# Patient Record
Sex: Male | Born: 1965 | Race: Black or African American | Hispanic: Refuse to answer | Marital: Married | Smoking: Former smoker
Health system: Southern US, Community
[De-identification: ages and names within clinical notes are randomized; demographics above are authoritative.]

## PROBLEM LIST (undated history)

## (undated) DIAGNOSIS — J45909 Unspecified asthma, uncomplicated: Secondary | ICD-10-CM

## (undated) DIAGNOSIS — Z973 Presence of spectacles and contact lenses: Secondary | ICD-10-CM

## (undated) DIAGNOSIS — Z87442 Personal history of urinary calculi: Secondary | ICD-10-CM

## (undated) DIAGNOSIS — F17290 Nicotine dependence, other tobacco product, uncomplicated: Secondary | ICD-10-CM

## (undated) DIAGNOSIS — N400 Enlarged prostate without lower urinary tract symptoms: Secondary | ICD-10-CM

## (undated) DIAGNOSIS — J302 Other seasonal allergic rhinitis: Secondary | ICD-10-CM

## (undated) DIAGNOSIS — Z9109 Other allergy status, other than to drugs and biological substances: Secondary | ICD-10-CM

## (undated) DIAGNOSIS — T7840XA Allergy, unspecified, initial encounter: Secondary | ICD-10-CM

## (undated) HISTORY — DX: Nicotine dependence, other tobacco product, uncomplicated: F17.290

## (undated) HISTORY — PX: POLYPECTOMY: SHX149

## (undated) HISTORY — DX: Allergy, unspecified, initial encounter: T78.40XA

## (undated) HISTORY — DX: Other allergy status, other than to drugs and biological substances: Z91.09

## (undated) HISTORY — DX: Unspecified asthma, uncomplicated: J45.909

## (undated) HISTORY — PX: WISDOM TOOTH EXTRACTION: SHX21

## (undated) HISTORY — DX: Presence of spectacles and contact lenses: Z97.3

## (undated) HISTORY — DX: Other seasonal allergic rhinitis: J30.2

## (undated) HISTORY — DX: Benign prostatic hyperplasia without lower urinary tract symptoms: N40.0

---
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
## 2012-05-26 ENCOUNTER — Encounter: Payer: Self-pay | Admitting: Medical

## 2012-05-26 ENCOUNTER — Ambulatory Visit (INDEPENDENT_AMBULATORY_CARE_PROVIDER_SITE_OTHER): Payer: BC Managed Care – PPO | Admitting: Medical

## 2012-05-26 VITALS — BP 102/80 | HR 60 | Temp 98.2°F | Ht 73.0 in | Wt 230.0 lb

## 2012-05-26 DIAGNOSIS — L918 Other hypertrophic disorders of the skin: Secondary | ICD-10-CM

## 2012-05-26 DIAGNOSIS — L909 Atrophic disorder of skin, unspecified: Secondary | ICD-10-CM

## 2012-05-26 DIAGNOSIS — R1310 Dysphagia, unspecified: Secondary | ICD-10-CM

## 2012-05-26 DIAGNOSIS — Z0000 Encounter for general adult medical examination without abnormal findings: Secondary | ICD-10-CM

## 2012-05-26 DIAGNOSIS — Z23 Encounter for immunization: Secondary | ICD-10-CM

## 2012-05-26 LAB — POCT URINALYSIS DIPSTICK
Bilirubin, UA: NEGATIVE
Blood, UA: NEGATIVE
Ketones, UA: NEGATIVE
Nitrite, UA: NEGATIVE
pH, UA: 5

## 2012-05-26 NOTE — Progress Notes (Signed)
Subjective:   HPI  Alexander Dean is a 46 y.o. male who presents for a complete physical.  Here as a new patient.  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  Working at [employer] in athletics.     Preventative care: Last ophthalmology visit: 2011 Last dental visit:04/2012 Last colonoscopy:N/A Last prostate exam: 2011 Last ZOX:WRUEAV Last labs:2011  Prior vaccinations: TD or Tdap:UNSURE Influenza:NO, declines Pneumococcal:N/A Shingles/Zostavax:N/A  Advanced directive:N/A Health care power of attorney:YES Living will:N/A  Concerns: Skin lesion left inner thigh, rubs and chafes when running, has to put bandaid over this.  Having issues with solid food feeling like it gets stuck in chest.  Not all the time, intermittent, no vomiting, no problems with water or liquids.  Reviewed their medical, surgical, family, social, medication, and allergy history and updated chart as appropriate.   Past Medical History  Diagnosis Date  . Asthma   . Seasonal allergic rhinitis   . Wears glasses     Past Surgical History  Procedure Date  . Wisdom tooth extraction   . Appendectomy 1983    Family History  Problem Relation Age of Onset  . Other Mother     renal stones  . Diabetes Mother   . Hypertension Mother   . Arthritis Father     knees  . Other Father   . Cancer Neg Hx   . Heart disease Neg Hx   . Hyperlipidemia Neg Hx     History   Social History  . Marital Status: Married    Spouse Name: N/A    Number of Children: N/A  . Years of Education: N/A   Occupational History  . Not on file.   Social History Main Topics  . Smoking status: Current Some Day Smoker    Types: Cigars  . Smokeless tobacco: Not on file  . Alcohol Use: 0.0 oz/week    0 drink(s) per week  . Drug Use: No  . Sexually Active: Not on file   Other Topics Concern  . Not on file   Social History Narrative   Married, no children, exercises 4 days per week, 90 min  per day, works in [employer], [place] [employer]    No current outpatient prescriptions on file prior to visit.    No Known Allergies    Review of Systems Constitutional: -fever, -chills, -sweats, -unexpected weight change, -decreased appetite, -fatigue Allergy: -sneezing, -itching, -congestion Dermatology: -changing moles, --rash, -lumps ENT: -runny nose, -ear pain, -sore throat, -hoarseness, -sinus pain, -teeth pain, - ringing in ears, -hearing loss, -nosebleeds Cardiology: -chest pain, -palpitations, -swelling, -difficulty breathing when lying flat, -waking up short of breath Respiratory: -cough, -shortness of breath, -difficulty breathing with exercise or exertion, -wheezing, -coughing up blood Gastroenterology: +abdominal pain, -nausea, -vomiting, -diarrhea, -constipation, -blood in stool, -changes in bowel movement, -difficulty swallowing or eating Hematology: -bleeding, -bruising  Musculoskeletal: -joint aches, -muscle aches, -joint swelling, -back pain, -neck pain, -cramping, -changes in gait Ophthalmology: denies vision changes, eye redness, itching, discharge Urology: -burning with urination, -difficulty urinating, -blood in urine, -urinary frequency, -urgency, -incontinence Neurology: -headache, -weakness, -tingling, -numbness, -memory loss, -falls, -dizziness Psychology: -depressed mood, -agitation, -sleep problems     Objective:   Physical Exam  Filed Vitals:   05/26/12 1022  Height: 6\' 1"  (1.854 m)  Weight: 230 lb (104.327 kg)    General appearance: alert, no distress, WD/WN, AA male Skin: left superior inner thigh with pedunculated 3mm x 4mm lesion , few  scattered macules, no worrisome lesions HEENT: normocephalic, conjunctiva/corneas normal, sclerae anicteric, PERRLA, EOMi, nares patent, no discharge or erythema, pharynx normal Oral cavity: MMM, tongue normal, teeth in good repair Neck: supple, no lymphadenopathy, no thyromegaly, no masses, normal ROM, no  bruits Chest: non tender, normal shape and expansion Heart: RRR, normal S1, S2, no murmurs Lungs: CTA bilaterally, no wheezes, rhonchi, or rales Abdomen: +bs, soft, non tender, non distended, no masses, no hepatomegaly, no splenomegaly, no bruits Back: non tender, normal ROM, no scoliosis Musculoskeletal: upper extremities non tender, no obvious deformity, normal ROM throughout, lower extremities non tender, no obvious deformity, normal ROM throughout Extremities: no edema, no cyanosis, no clubbing Pulses: 2+ symmetric, upper and lower extremities, normal cap refill Neurological: alert, oriented x 3, CN2-12 intact, strength normal upper extremities and lower extremities, sensation normal throughout, DTRs 2+ throughout, no cerebellar signs, gait normal Psychiatric: normal affect, behavior normal, pleasant  GU: normal male external genitalia, circumcised, nontender, no masses, no hernia, no lymphadenopathy Rectal: anus normal, prostate WNL, no obvious nodules   Assessment and Plan :      Encounter Diagnoses  Name Primary?  . Routine general medical examination at a health care facility Yes  . Need for Tdap vaccination   . Dysphagia   . Skin tag     Physical exam - discussed healthy lifestyle, diet, exercise, preventative care, vaccinations, and addressed their concerns.  Advised yearly dental and ophthalmology f/u.  Tdap vaccine, VIS and counseling given.  Dysphagia - advised avoid GERD triggers, advised he drink more water with his daily multivitamin or consider stopping multivitamin for a week or 2 and see if this resolves the issue.  If not improving consider adding OTC Prilosec or Zantac.  Recheck in 58mo.  If not improving, consider swallow study.    Skin tag - return at his convenience for excision.  Follow-up pending labs

## 2012-05-27 LAB — COMPREHENSIVE METABOLIC PANEL
ALT: 16 U/L (ref 0–53)
AST: 22 U/L (ref 0–37)
Albumin: 4.5 g/dL (ref 3.5–5.2)
Calcium: 9.8 mg/dL (ref 8.4–10.5)
Chloride: 104 mEq/L (ref 96–112)
Potassium: 4.8 mEq/L (ref 3.5–5.3)

## 2012-05-27 LAB — CBC WITH DIFFERENTIAL/PLATELET
Basophils Absolute: 0 K/uL (ref 0.0–0.1)
Basophils Relative: 0 % (ref 0–1)
Eosinophils Absolute: 0.2 K/uL (ref 0.0–0.7)
Eosinophils Relative: 3 % (ref 0–5)
HCT: 44.8 % (ref 39.0–52.0)
Hemoglobin: 15 g/dL (ref 13.0–17.0)
Lymphocytes Relative: 40 % (ref 12–46)
Lymphs Abs: 2.8 K/uL (ref 0.7–4.0)
MCH: 30.4 pg (ref 26.0–34.0)
MCHC: 33.5 g/dL (ref 30.0–36.0)
MCV: 90.7 fL (ref 78.0–100.0)
Monocytes Absolute: 0.4 K/uL (ref 0.1–1.0)
Monocytes Relative: 6 % (ref 3–12)
Neutro Abs: 3.5 K/uL (ref 1.7–7.7)
Neutrophils Relative %: 51 % (ref 43–77)
Platelets: 210 K/uL (ref 150–400)
RBC: 4.94 MIL/uL (ref 4.22–5.81)
RDW: 14.6 % (ref 11.5–15.5)
WBC: 6.9 K/uL (ref 4.0–10.5)

## 2012-05-27 LAB — LIPID PANEL
Cholesterol: 131 mg/dL (ref 0–200)
HDL: 41 mg/dL
LDL Cholesterol: 65 mg/dL (ref 0–99)
Total CHOL/HDL Ratio: 3.2 ratio
Triglycerides: 123 mg/dL
VLDL: 25 mg/dL (ref 0–40)

## 2013-06-22 ENCOUNTER — Encounter: Payer: Self-pay | Admitting: Medical

## 2013-06-22 ENCOUNTER — Ambulatory Visit (INDEPENDENT_AMBULATORY_CARE_PROVIDER_SITE_OTHER): Payer: BC Managed Care – PPO | Admitting: Medical

## 2013-06-22 VITALS — BP 110/70 | HR 72 | Temp 97.8°F | Resp 16 | Ht 72.0 in | Wt 227.0 lb

## 2013-06-22 DIAGNOSIS — D492 Neoplasm of unspecified behavior of bone, soft tissue, and skin: Secondary | ICD-10-CM

## 2013-06-22 DIAGNOSIS — Z0000 Encounter for general adult medical examination without abnormal findings: Secondary | ICD-10-CM

## 2013-06-22 DIAGNOSIS — N400 Enlarged prostate without lower urinary tract symptoms: Secondary | ICD-10-CM

## 2013-06-22 LAB — POCT URINALYSIS DIPSTICK
Blood, UA: NEGATIVE
Glucose, UA: NEGATIVE
Ketones, UA: NEGATIVE
Spec Grav, UA: <=1.005
Urobilinogen, UA: NEGATIVE

## 2013-06-22 LAB — BASIC METABOLIC PANEL
BUN: 18 mg/dL (ref 6–23)
CO2: 27 mEq/L (ref 19–32)
Glucose, Bld: 79 mg/dL (ref 70–99)
Potassium: 4.1 mEq/L (ref 3.5–5.3)

## 2013-06-22 NOTE — Progress Notes (Addendum)
Subjective:   HPI  Alexander Dean is a 47 y.o. male who presents for a complete physical.  Doing well, no c/o.    Preventative care: Last ophthalmology visit:yes -[facility] Last dental visit:yes [facility] Last colonoscopy:n/a Last prostate exam: n/a Last EKG:n/a Last labs:2013  Prior vaccinations: TD or Tdap:05/2012 Influenza:no flu vaccine, declines Pneumococcal:n/a Shingles/Zostavax:n/   Advanced directive:n/a  Health care power of attorney:n/a Living will:n/   Concerns: Skin lesion, left upper inner thigh, catches on underwear at times.  Would like this removed.  No other c/o.  Reviewed their medical, surgical, family, social, medication, and allergy history and updated chart as appropriate.  Past Medical History  Diagnosis Date  . Seasonal allergic rhinitis   . Wears glasses   . Asthma     childhood  . BPH (benign prostatic hypertrophy)     per exam and PSA, asymptomatic    Past Surgical History  Procedure Laterality Date  . Wisdom tooth extraction    . Appendectomy  1983    History   Social History  . Marital Status: Married    Spouse Name: N/A    Number of Children: N/A  . Years of Education: N/A   Occupational History  . Not on file.   Social History Main Topics  . Smoking status: Current Some Day Smoker    Types: Cigars  . Smokeless tobacco: Not on file     Comment: one cigar weekly  . Alcohol Use: 0.0 oz/week    0 drink(s) per week  . Drug Use: No  . Sexual Activity: Not on file   Other Topics Concern  . 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 Other Mother     renal stones  . Diabetes Mother   . Hypertension Mother   . Arthritis Father     knees  . Other Father     renal stones  . Cancer Neg Hx   .  Hyperlipidemia Neg Hx     Current outpatient prescriptions:Multiple Vitamin (MULTIVITAMIN) capsule, Take 1 capsule by mouth daily., Disp: , Rfl:   No Known Allergies   Review of Systems Constitutional: -fever, -chills, -sweats, -unexpected weight change, -decreased appetite, -fatigue Allergy: -sneezing, -itching, -congestion Dermatology: -changing moles, --rash, -lumps ENT: -runny nose, -ear pain, -sore throat, -hoarseness, -sinus pain, -teeth pain, - ringing in ears, -hearing loss, -nosebleeds Cardiology: -chest pain, -palpitations, -swelling, -difficulty breathing when lying flat, -waking up short of breath Respiratory: -cough, -shortness of breath, -difficulty breathing with exercise or exertion, -wheezing, -coughing up blood Gastroenterology: -abdominal pain, -nausea, -vomiting, -diarrhea, +constipation, -blood in stool, -changes in bowel movement, -difficulty swallowing or eating Hematology: -bleeding, -bruising  Musculoskeletal: -joint aches, -muscle aches, -joint swelling, -back pain, -neck pain, -cramping, -changes in gait Ophthalmology: denies vision changes, eye redness, itching, discharge Urology: -burning with urination, -difficulty urinating, -blood in urine, -urinary frequency, -urgency, -incontinence Neurology: -headache, -weakness, -tingling, -numbness, -memory loss, -falls, -dizziness Psychology: -depressed mood, -agitation, -sleep problems     Objective:   Physical Exam  Filed Vitals:   06/22/13 1044  BP: 110/70  Pulse: 72  Temp: 97.8 F (36.6 C)  Resp: 16    General appearance: alert, no distress, WD/WN, AA male Skin: left upper inner thigh with 3mm x  2mm pedunculated fleshy lesion, likely skin tag, right lower abdomen with 1.4 cm x 1.4cm squarish flat brown lesion c/w birth mark similar small 1.5cm x 0.5cm rectangular flat  Brown lesion of right lower back, few other scattered benign appearing macules, no other worrisome lesions HEENT: normocephalic,  conjunctiva/corneas normal, sclerae anicteric, PERRLA, EOMi, nares patent, no discharge or erythema, pharynx normal Oral cavity: MMM, tongue normal, teeth  - left upper molar and right upper molar, both with some fractures of the tooth, otherwise teeth in good repair Neck: supple, no lymphadenopathy, no thyromegaly, no masses, normal ROM, no bruits Chest: non tender, normal shape and expansion Heart: RRR, normal S1, S2, no murmurs Lungs: CTA bilaterally, no wheezes, rhonchi, or rales Abdomen: +bs, soft, RLQ surgical scar, non tender, non distended, no masses, no hepatomegaly, no splenomegaly, no bruits Back: non tender, normal ROM, no scoliosis Musculoskeletal: upper extremities non tender, no obvious deformity, normal ROM throughout, lower extremities non tender, no obvious deformity, normal ROM throughout Extremities: no edema, no cyanosis, no clubbing Pulses: 2+ symmetric, upper and lower extremities, normal cap refill Neurological: alert, oriented x 3, CN2-12 intact, strength normal upper extremities and lower extremities, sensation normal throughout, DTRs 2+ throughout, no cerebellar signs, gait normal Psychiatric: normal affect, behavior normal, pleasant  GU: normal male external genitalia, nontender, no masses, no hernia, no lymphadenopathy Rectal: anus normal tone and appearance, prostate generally enlarged, no nodules, nontender, otherwise unremarkable   Assessment and Plan :      Encounter Diagnoses  Name Primary?  . Routine general medical examination at a health care facility Yes  . BPH (benign prostatic hypertrophy)   . Neoplasm of skin     Physical exam - discussed healthy lifestyle, diet, exercise, preventative care, vaccinations, and addressed their concerns.   BPH - asymptomatic, PSA monitoring today. Neoplasm - likely skin tag, but sent to pathology to confirm/rule out other.  Discussed wound care.  F/u prn.  Follow-up pending labs, pathology results.   Addendum: I  inadvertently forgot to document the procedure. Discussed risk and benefits of procedure, patient gave consent, use 1% lidocaine with epinephrine for local anesthesia of left upper thigh, used sterile razor to shave biopsy the 3mm x 2mm pedunculated fleshy lesion, likely skin tag.  Hemostasis achieved with direct pressure and silver nitrate applied. Estimated blood loss less than 1 cc, patient tolerated procedure well, lesion sent to pathology.

## 2013-06-24 ENCOUNTER — Other Ambulatory Visit: Payer: Self-pay | Admitting: Medical

## 2013-06-24 DIAGNOSIS — R7989 Other specified abnormal findings of blood chemistry: Secondary | ICD-10-CM

## 2013-06-26 ENCOUNTER — Telehealth: Payer: Self-pay | Admitting: Family Medicine

## 2013-06-26 NOTE — Telephone Encounter (Signed)
Pt called and wants status for renal ultrasound.  Please call pt [phone].  I see order no appt.  Please advise pt.

## 2013-06-29 NOTE — Telephone Encounter (Signed)
Patient is aware of his appointment. CLS

## 2013-07-07 ENCOUNTER — Ambulatory Visit
Admission: RE | Admit: 2013-07-07 | Discharge: 2013-07-07 | Disposition: A | Discharge status: Home / Self Care | Payer: BC Managed Care – PPO | Source: Ambulatory Visit | Attending: Medical | Admitting: Medical

## 2013-07-07 DIAGNOSIS — R7989 Other specified abnormal findings of blood chemistry: Secondary | ICD-10-CM

## 2013-07-10 ENCOUNTER — Encounter: Payer: Self-pay | Admitting: Medical

## 2013-07-10 ENCOUNTER — Ambulatory Visit (INDEPENDENT_AMBULATORY_CARE_PROVIDER_SITE_OTHER): Payer: BC Managed Care – PPO | Admitting: Medical

## 2013-07-10 VITALS — BP 112/80 | HR 72 | Temp 98.0°F | Resp 16 | Wt 225.0 lb

## 2013-07-10 DIAGNOSIS — R7989 Other specified abnormal findings of blood chemistry: Secondary | ICD-10-CM

## 2013-07-10 DIAGNOSIS — R51 Headache: Secondary | ICD-10-CM

## 2013-07-10 DIAGNOSIS — R935 Abnormal findings on diagnostic imaging of other abdominal regions, including retroperitoneum: Secondary | ICD-10-CM

## 2013-07-10 DIAGNOSIS — R799 Abnormal finding of blood chemistry, unspecified: Secondary | ICD-10-CM

## 2013-07-10 DIAGNOSIS — N400 Enlarged prostate without lower urinary tract symptoms: Secondary | ICD-10-CM

## 2013-07-10 LAB — PHOSPHORUS: PHOSPHORUS: 3.3 mg/dL (ref 2.3–4.6)

## 2013-07-10 NOTE — Progress Notes (Signed)
  Subjective:  Alexander Dean is a 48 y.o. male who presents for recheck to discuss abnormal labs and f/u.  We discussed his recent abnormal findings regarding prostate and kidney function. He has no BPH symptoms at this time, no fevers weight loss night sweats. He denies a history of abnormal creatinine or kidney function, but has had microscopic hematuria in the past. He has never seen urology or nephrology. He says he drinks a lot of water, he does take BC powders occasionally for headaches on average once weekly, but denies other NSAID use. Denies dark urine or urinary changes otherwise.  No other c/o.  The following portions of the patient's history were reviewed and updated as appropriate: allergies, current medications, past family history, past medical history, past social history, past surgical history and problem list.  ROS Otherwise as in subjective above  Objective: Physical Exam  BP 112/80  Pulse 72  Temp(Src) 98 F (36.7 C) (Oral)  Resp 16  Wt 225 lb (102.059 kg)   General appearance: alert, no distress, WD/WN   Assessment: Encounter Diagnoses  Name Primary?  . Elevated serum creatinine Yes  . BPH (benign prostatic hypertrophy)   . Abnormal ultrasound of abdomen   . Headache(784.0)     Plan: I discussed the recent abnormal findings which include enlarged prostate on exam, mildly elevated PSA, abnormal abdomen ultrasound regarding the prostate although the kidneys looked normal, abnormal serum creatinine.  I advise he stop NSAIDs and BC powders. For his intermittent headaches can use Tylenol over-the-counter, discussed headache prevention.  We will plan to recheck PSA and prostate exam in 4-6 months unless he has any symptoms of BPH sooner.  He is drinking plenty of water. Discussed possible causes of abnormal kidney lab. I answered his questions today. Follow up: pending labs, 83mo on prostate issues

## 2013-07-11 LAB — MICROALBUMIN / CREATININE URINE RATIO
Creatinine, Urine: 107 mg/dL
MICROALB UR: 0.5 mg/dL (ref 0.00–1.89)
Microalb Creat Ratio: 4.7 mg/g (ref 0.0–30.0)

## 2013-07-11 LAB — URINALYSIS, MICROSCOPIC ONLY
Bacteria, UA: NONE SEEN
Casts: NONE SEEN
Crystals: NONE SEEN
SQUAMOUS EPITHELIAL / LPF: NONE SEEN

## 2013-07-11 LAB — VITAMIN D 25 HYDROXY (VIT D DEFICIENCY, FRACTURES): VIT D 25 HYDROXY: 33 ng/mL (ref 30–89)

## 2013-07-16 ENCOUNTER — Encounter: Payer: Self-pay | Admitting: Medical

## 2014-06-23 ENCOUNTER — Encounter: Payer: Self-pay | Admitting: Medical

## 2014-06-23 ENCOUNTER — Ambulatory Visit (INDEPENDENT_AMBULATORY_CARE_PROVIDER_SITE_OTHER): Payer: BC Managed Care – PPO | Admitting: Medical

## 2014-06-23 VITALS — BP 100/70 | HR 78 | Temp 97.6°F | Resp 16 | Ht 72.0 in | Wt 235.0 lb

## 2014-06-23 DIAGNOSIS — M791 Myalgia: Secondary | ICD-10-CM

## 2014-06-23 DIAGNOSIS — M546 Pain in thoracic spine: Secondary | ICD-10-CM

## 2014-06-23 DIAGNOSIS — G479 Sleep disorder, unspecified: Secondary | ICD-10-CM

## 2014-06-23 DIAGNOSIS — R312 Other microscopic hematuria: Secondary | ICD-10-CM

## 2014-06-23 DIAGNOSIS — R748 Abnormal levels of other serum enzymes: Secondary | ICD-10-CM

## 2014-06-23 DIAGNOSIS — M549 Dorsalgia, unspecified: Secondary | ICD-10-CM

## 2014-06-23 DIAGNOSIS — Z0000 Encounter for general adult medical examination without abnormal findings: Secondary | ICD-10-CM

## 2014-06-23 DIAGNOSIS — R0683 Snoring: Secondary | ICD-10-CM

## 2014-06-23 DIAGNOSIS — R7989 Other specified abnormal findings of blood chemistry: Secondary | ICD-10-CM

## 2014-06-23 DIAGNOSIS — M7918 Myalgia, other site: Secondary | ICD-10-CM

## 2014-06-23 DIAGNOSIS — Z720 Tobacco use: Secondary | ICD-10-CM

## 2014-06-23 DIAGNOSIS — N400 Enlarged prostate without lower urinary tract symptoms: Secondary | ICD-10-CM

## 2014-06-23 DIAGNOSIS — R3129 Other microscopic hematuria: Secondary | ICD-10-CM

## 2014-06-23 DIAGNOSIS — F17200 Nicotine dependence, unspecified, uncomplicated: Secondary | ICD-10-CM | POA: Insufficient documentation

## 2014-06-23 LAB — COMPREHENSIVE METABOLIC PANEL
ALT: 48 U/L (ref 0–53)
AST: 41 U/L — AB (ref 0–37)
Albumin: 4.2 g/dL (ref 3.5–5.2)
Alkaline Phosphatase: 45 U/L (ref 39–117)
BILIRUBIN TOTAL: 0.4 mg/dL (ref 0.2–1.2)
BUN: 18 mg/dL (ref 6–23)
CALCIUM: 9.3 mg/dL (ref 8.4–10.5)
CO2: 27 mEq/L (ref 19–32)
CREATININE: 1.55 mg/dL — AB (ref 0.50–1.35)
Chloride: 104 mEq/L (ref 96–112)
Glucose, Bld: 81 mg/dL (ref 70–99)
Potassium: 4.3 mEq/L (ref 3.5–5.3)
Sodium: 140 mEq/L (ref 135–145)
Total Protein: 7 g/dL (ref 6.0–8.3)

## 2014-06-23 LAB — POCT URINALYSIS DIPSTICK
Bilirubin, UA: NEGATIVE
Glucose, UA: NEGATIVE
Ketones, UA: NEGATIVE
Leukocytes, UA: NEGATIVE
Nitrite, UA: NEGATIVE
PROTEIN UA: NEGATIVE
Spec Grav, UA: >=1.03
Urobilinogen, UA: NEGATIVE
pH, UA: 6

## 2014-06-23 LAB — LIPID PANEL
CHOL/HDL RATIO: 3.9 ratio
CHOLESTEROL: 169 mg/dL (ref 0–200)
HDL: 43 mg/dL (ref >39)
LDL Cholesterol: 88 mg/dL (ref 0–99)
Triglycerides: 188 mg/dL — ABNORMAL HIGH (ref <150)
VLDL: 38 mg/dL (ref 0–40)

## 2014-06-23 NOTE — Progress Notes (Signed)
Subjective:   HPI  Alexander Dean is a 48 y.o. male who presents for a complete physical.   Preventative care: Last ophthalmology visit:yes [facility] 07/2013- last exam Last dental visit:yes [facility] Last colonoscopy:n//a Last prostate exam: Last EKG:n/a Last labs:?  Prior vaccinations: TD or ZHGD:9242 Influenza:declined flu vaccine Pneumococcal:n/a Shingles/Zostavax:n/a  Concerns: Recently having some right upper back pain and left buttock pain.   Sometimes gets upper back pain when he awakes from sleeping.   Was runnning more strenuously recently, may have pulled muscle in left buttock.   Reviewed their medical, surgical, family, social, medication, and allergy history and updated chart as appropriate.  Past Medical History  Diagnosis Date  . Seasonal allergic rhinitis   . Wears glasses   . Asthma     childhood  . BPH (benign prostatic hypertrophy)     per exam and PSA, asymptomatic  . Cigar smoker     Past Surgical History  Procedure Laterality Date  . Wisdom tooth extraction    . Appendectomy  1983    History   Social History  . Marital Status: Married    Spouse Name: N/A    Number of Children: N/A  . Years of Education: N/A   Occupational History  . Not on file.   Social History Main Topics  . Smoking status: Current Some Day Smoker    Types: Cigars  . Smokeless tobacco: Not on file     Comment: one cigar weekly  . Alcohol Use: 0.0 oz/week    0 Not specified per week  . Drug Use: No  . Sexual Activity: Not on file   Other Topics Concern  . 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 Other Mother     renal stones  . Diabetes Mother   . Hypertension Mother   . Arthritis Father     knees  . Other Father     renal stones  . Cancer Neg  Hx   . Hyperlipidemia Neg Hx     Current outpatient prescriptions: Multiple Vitamin (MULTIVITAMIN) capsule, Take 1 capsule by mouth daily., Disp: , Rfl:   No Known Allergies   Review of Systems Constitutional: -fever, -chills, -sweats, -unexpected weight change, -decreased appetite, -fatigue Allergy: -sneezing, -itching, -congestion Dermatology: -changing moles, --rash, -lumps ENT: -runny nose, -ear pain, -sore throat, -hoarseness, -sinus pain, -teeth pain, - ringing in ears, -hearing loss, -nosebleeds Cardiology: -chest pain, -palpitations, -swelling, -difficulty breathing when lying flat, -waking up short of breath Respiratory: -cough, -shortness of breath, -difficulty breathing with exercise or exertion, -wheezing, -coughing up blood Gastroenterology: -abdominal pain, -nausea, -vomiting, -diarrhea, -constipation, -blood in stool, -changes in bowel movement, -difficulty swallowing or eating Hematology: -bleeding, -bruising  Musculoskeletal: +joint aches(left hip area when running), -muscle aches, -joint swelling, -back pain, +neck pain(crick in his neck), -cramping, -changes in gait Ophthalmology: denies vision changes, eye redness, itching, discharge Urology: -burning with urination, -difficulty urinating, -blood in urine, -urinary frequency, -urgency, -incontinence Neurology: -headache, -weakness, -tingling, -numbness, -memory loss, -falls, -dizziness Psychology: -depressed mood, -agitation, -sleep problems     Objective:   Physical Exam  BP 100/70 mmHg  Pulse 78  Temp(Src) 97.6 F (36.4 C) (Oral)  Resp 16  Ht 6' (1.829 m)  Wt 235 lb (106.595 kg)  BMI 31.86 kg/m2  General appearance: alert, no distress, WD/WN, AA male Skin: right lower abdomen with 1.4 cm x 1.4cm squarish flat brown lesion c/w birth mark similar, small 1.5cm x 0.5cm rectangular flatbrown lesion of right lower back, few other scattered benign appearing macules, no other worrisome lesions HEENT:  normocephalic, conjunctiva/corneas normal, sclerae anicteric, PERRLA, EOMi, nares patent, no discharge or erythema, pharynx normal Oral cavity: MMM, tongue normal, teeth - left upper molar and right upper molar, both with some fractures of the tooth, otherwise teeth in good repair Neck: supple, no lymphadenopathy, no thyromegaly, no masses, normal ROM, no bruits Chest: non tender, normal shape and expansion Heart: RRR, normal S1, S2, no murmurs Lungs: CTA bilaterally, no wheezes, rhonchi, or rales Abdomen: +bs, soft, RLQ surgical scar, non tender, non distended, no masses, no hepatomegaly, no splenomegaly, no bruits Back: non tender, normal ROM, no scoliosis Musculoskeletal: upper extremities non tender, no obvious deformity, normal ROM throughout, lower extremities non tender, no obvious deformity, normal ROM throughout Extremities: no edema, no cyanosis, no clubbing Pulses: 2+ symmetric, upper and lower extremities, normal cap refill Neurological: alert, oriented x 3, CN2-12 intact, strength normal upper extremities and lower extremities, sensation normal throughout, DTRs 2+ throughout, no cerebellar signs, gait normal Psychiatric: normal affect, behavior normal, pleasant  GU: normal male external genitalia, circumcised, nontender, no masses, no hernia, no lymphadenopathy Rectal: anus normal tone and appearance, prostate generally enlarged, no nodules, nontender, otherwise unremarkable    Assessment and Plan :    Encounter Diagnoses  Name Primary?  . Encounter for health maintenance examination in adult Yes  . BPH (benign prostatic hypertrophy)   . Smoking   . Microscopic hematuria   . Left buttock pain   . Upper back pain on right side   . Elevated serum creatinine     Physical exam - discussed healthy lifestyle, diet, exercise, preventative care, vaccinations, and addressed their concerns.   See your eye doctor yearly for routine vision care. See your dentist yearly for routine  dental care including hygiene visits twice yearly. BPH - PSA lab today Smoking - advised he stop tobacco Microscopic hematuria - urine sent for micro Left buttock pain - etiology appears to be muscle strain.  Advised short term period of relative rest, heat, stretching, OTC NSAID Upper back pain - adivsed daily stretching, can use heat, massage, OTC NSAID Elevated creatinine - labs today Sleep disturbance, snoring - Epworth Sleepiness scale score of 5, neck circumference 17.2". Consider sleep study. Follow-up pending labs

## 2014-06-24 ENCOUNTER — Other Ambulatory Visit: Payer: Self-pay | Admitting: Medical

## 2014-06-24 DIAGNOSIS — R7989 Other specified abnormal findings of blood chemistry: Secondary | ICD-10-CM

## 2014-06-24 LAB — CBC
HEMATOCRIT: 45.8 % (ref 39.0–52.0)
HEMOGLOBIN: 15.4 g/dL (ref 13.0–17.0)
MCH: 30.1 pg (ref 26.0–34.0)
MCHC: 33.6 g/dL (ref 30.0–36.0)
MCV: 89.5 fL (ref 78.0–100.0)
MPV: 10.2 fL (ref 9.4–12.4)
Platelets: 205 10*3/uL (ref 150–400)
RBC: 5.12 MIL/uL (ref 4.22–5.81)
RDW: 14.5 % (ref 11.5–15.5)
WBC: 7.8 10*3/uL (ref 4.0–10.5)

## 2014-06-24 LAB — MICROALBUMIN / CREATININE URINE RATIO
Creatinine, Urine: 198.2 mg/dL
Microalb Creat Ratio: 2.5 mg/g (ref 0.0–30.0)
Microalb, Ur: 0.5 mg/dL (ref <2.0)

## 2014-06-24 LAB — URINALYSIS, MICROSCOPIC ONLY
Bacteria, UA: NONE SEEN
CRYSTALS: NONE SEEN
Casts: NONE SEEN
SQUAMOUS EPITHELIAL / LPF: NONE SEEN

## 2014-06-24 LAB — PSA: PSA: 2.32 ng/mL (ref <=4.00)

## 2014-06-25 ENCOUNTER — Telehealth: Payer: Self-pay | Admitting: Medical

## 2014-06-25 NOTE — Telephone Encounter (Signed)
Claybon Jabs, MD  Carlena Hurl, PA-C           Audelia Acton,   Thank you for your kind words regarding page and I do appreciate your referrals.   Re: Your patient with the large prostate this is one of those situations where the size of the prostate often times does not correlate with urinary symptoms as I'm sure you have noticed. The finding of calcifications on ultrasound of the prostate is a completely benign finding. It can be seen in some patients who have had a distant history of prostate infection but often times the "calcifications" that are being seen on ultrasound are actually inspissated/solidified prostate secretions in the small ducts of the prostate. Either way this is something that is seen quite frequently when I do prostate ultrasounds and will cause the patient no difficulty. This does not need to be evaluated further.   Hope you have some happy holidays,   Kathie Rhodes, M.D.       Previous Messages     ----- Message -----   From: Carlena Hurl, PA-C   Sent: 06/25/2014  7:48 AM    To: Claybon Jabs, MD, Carlena Hurl, PA-C  Subject: prostate question                 Hello Dr. Karsten Ro   I hope you are doing well. We always enjoy speaking to your wife when she comes to our office for lunches, and we appreciate you seeing our patients.   I have a question for you. I have a 48 yo AA male with a mildly enlarged prostate on exam, asymptomatic. Last year I had an ultrasound done of his abdomen due to abnormal creatinine. The [place] happened to show calcifications of the prostate. His PSAs are low/normal.   Would you recommend a consult due to the calcifications or other eval?   Thanks  Audelia Acton

## 2014-06-28 ENCOUNTER — Other Ambulatory Visit: Payer: BC Managed Care – PPO

## 2014-06-28 DIAGNOSIS — R7989 Other specified abnormal findings of blood chemistry: Secondary | ICD-10-CM

## 2014-06-29 LAB — CREATININE CLEARANCE, URINE, 24 HOUR
CREAT CLEAR: 120 mL/min (ref 75–125)
CREATININE 24H UR: 2567 mg/d — AB (ref 800–2000)
Creatinine, Urine: 122.2 mg/dL
Creatinine: 1.48 mg/dL — ABNORMAL HIGH (ref 0.50–1.35)

## 2014-12-11 IMAGING — US US RENAL
1 series · 14 of 25 positions shown · non-contrast
Comparison: None.

CLINICAL DATA: Elevated creatinine.

EXAM:
RENAL/URINARY TRACT ULTRASOUND COMPLETE

[Series 1: us renal · 0.28mm/px · 14 of 34 slices shown]
[im 1/34]
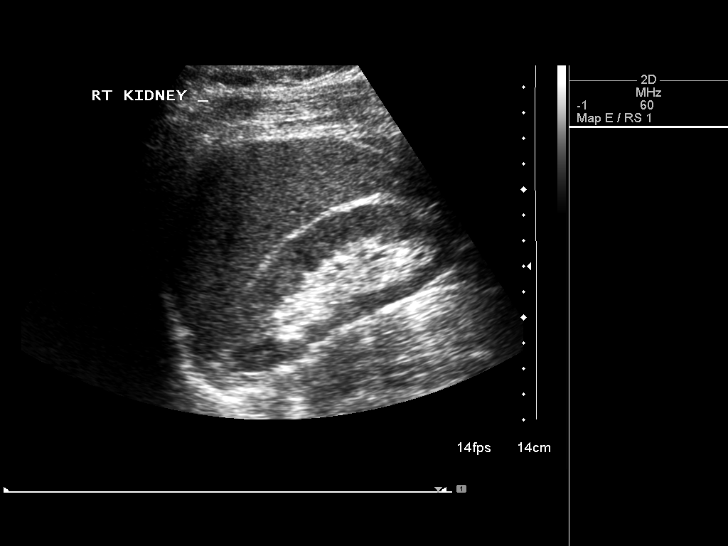
[im 3/34]
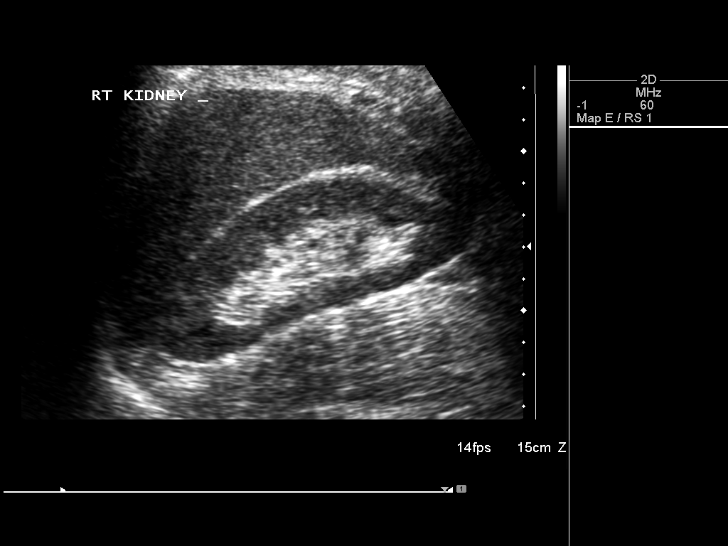
[im 6/34]
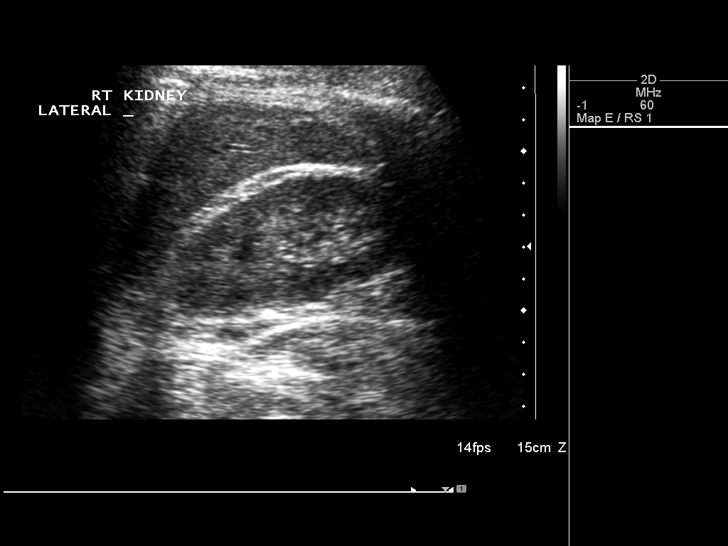
[im 9/34]
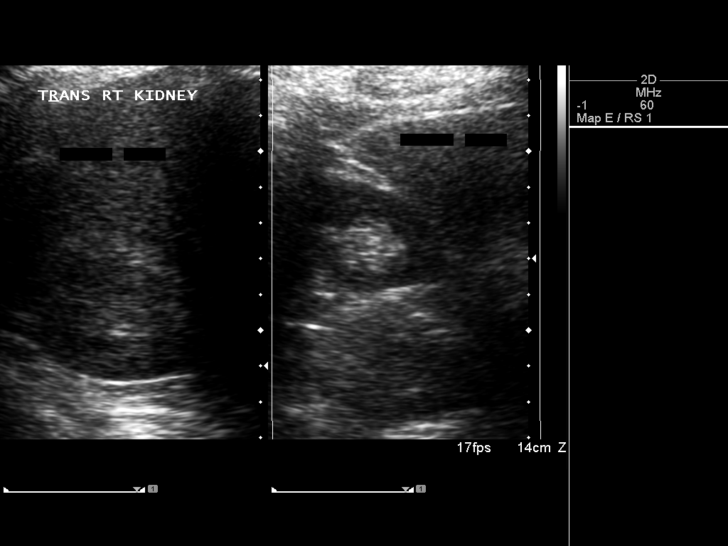
[im 12/34]
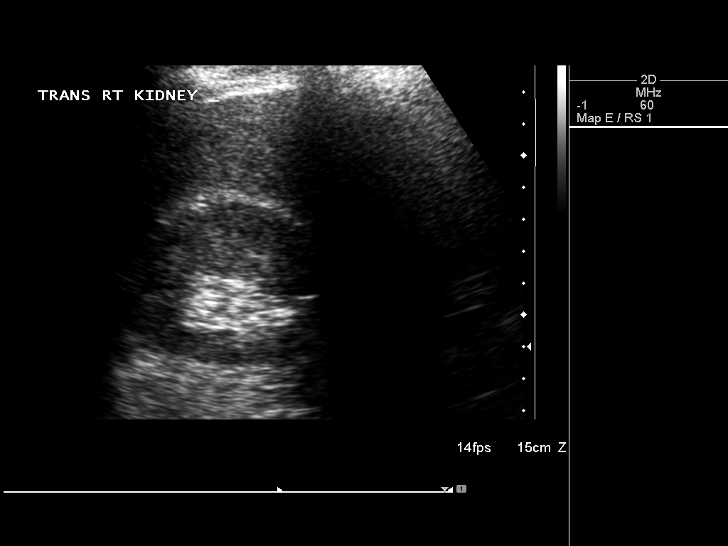
[im 13/34]
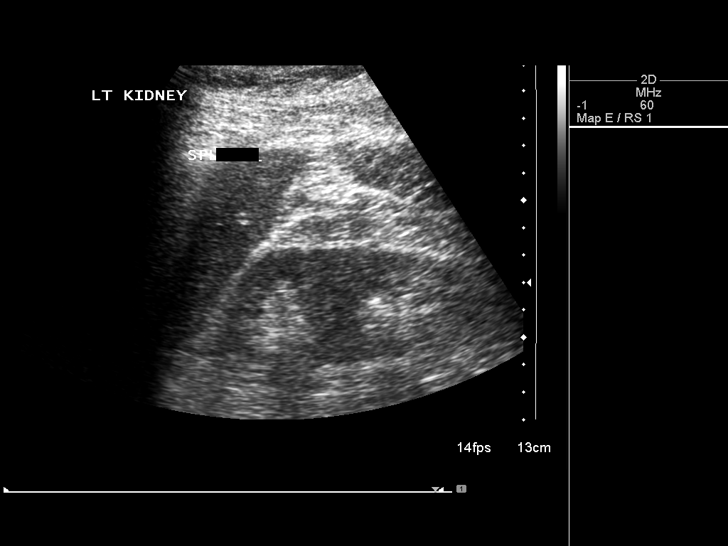
[im 16/34]
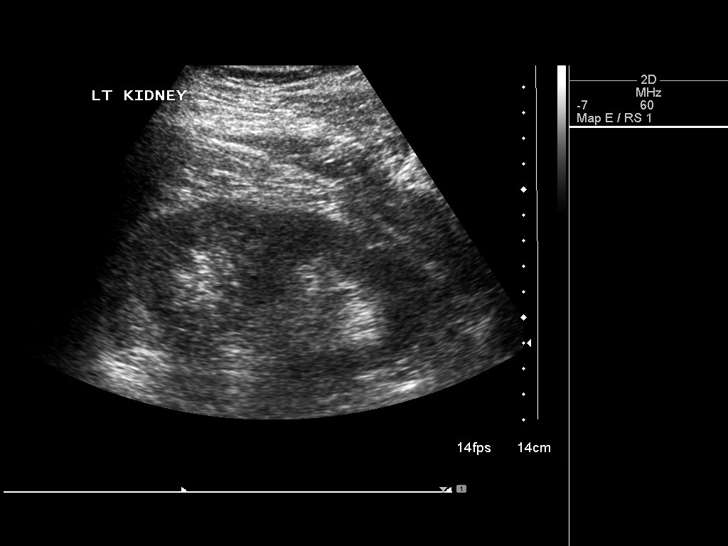
[im 18/34]
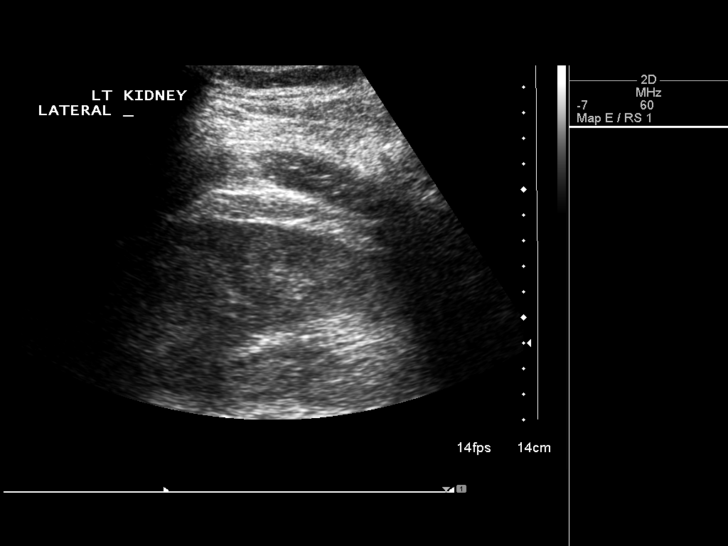
[im 21/34]
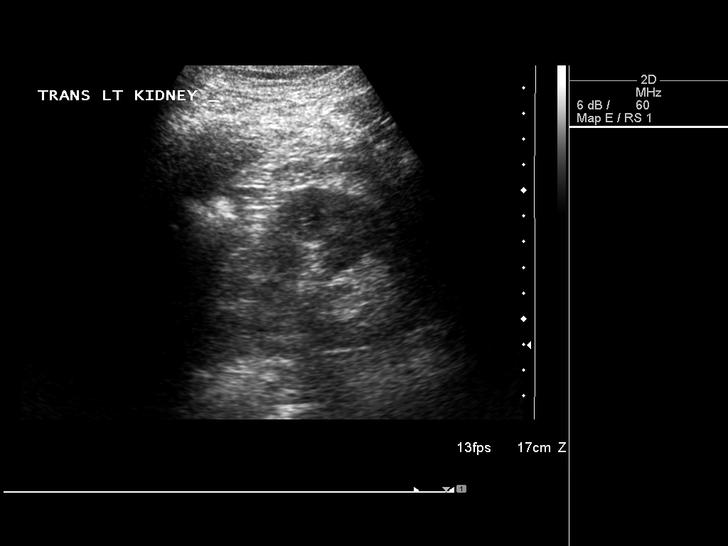
[im 23/34]
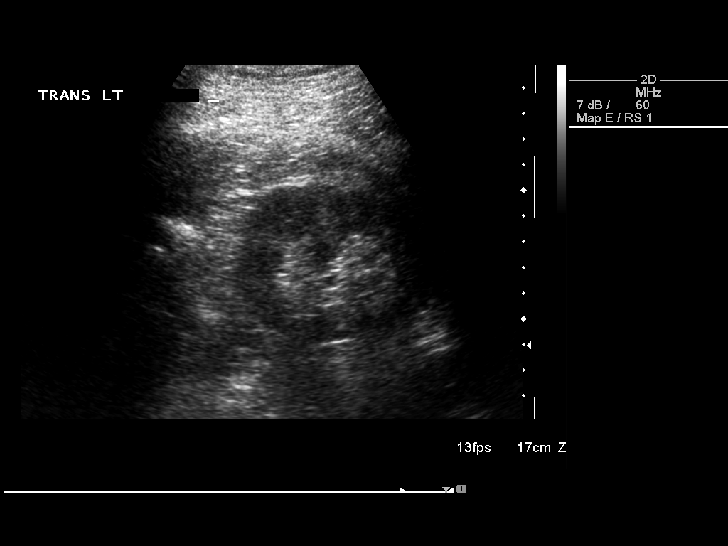
[im 25/34]
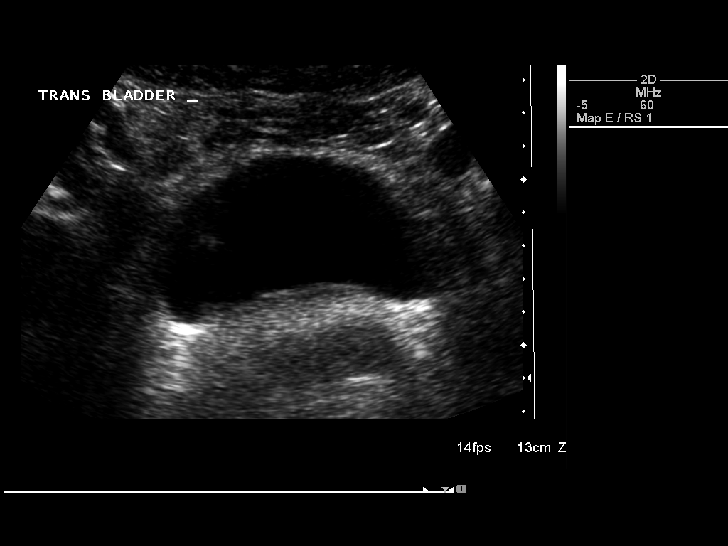
[im 28/34]
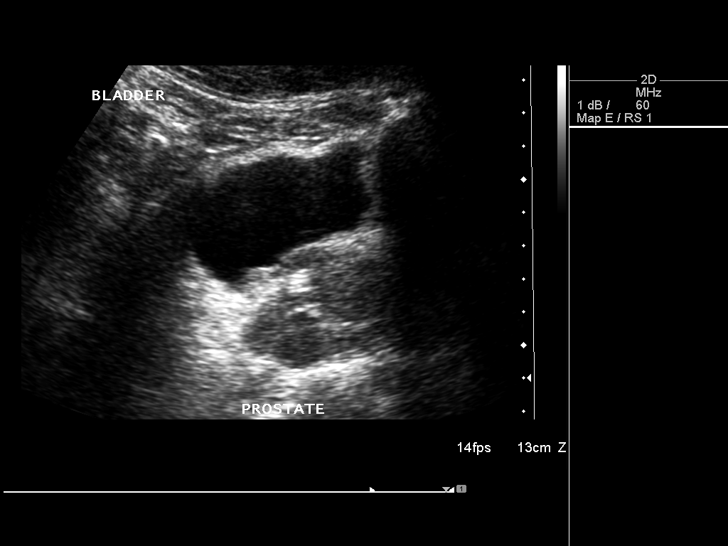
[im 31/34]
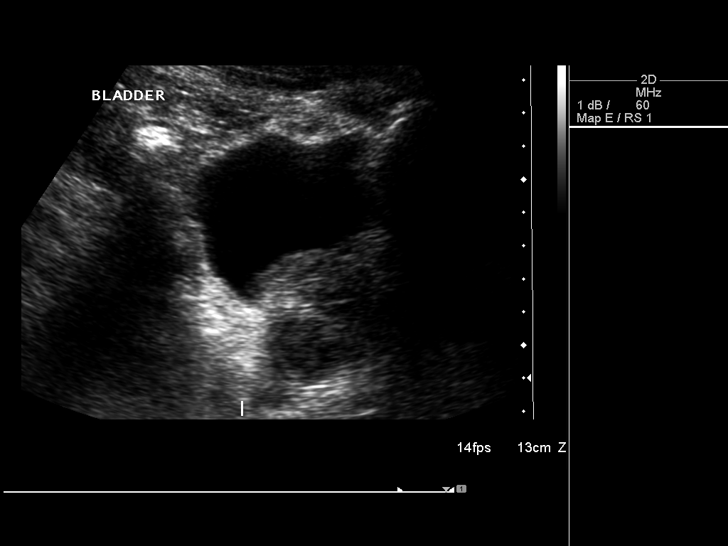
[im 34/34]
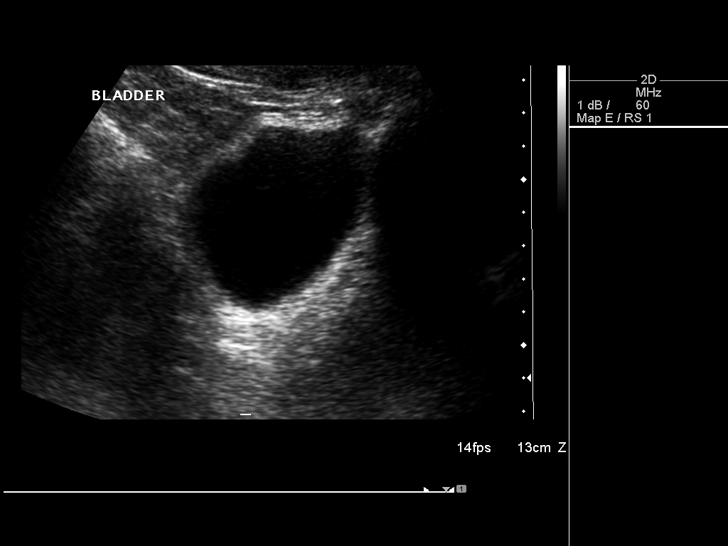

[14 of 25 positions shown; findings below may reference images not displayed]

FINDINGS: Right Kidney:

Length: 10.6 cm. Echogenicity within normal limits. No mass or
hydronephrosis visualized.

Left Kidney:

Length: 11.6 cm.. Echogenicity within normal limits. No mass or
hydronephrosis visualized.

Bladder:

Appears normal for degree of bladder distention.

Prostate is enlarged and slightly irregular with calcifications.
BPH, prostatitis, and/or prostate malignancy cannot be excluded.
IMPRESSION: 1. Normal renal ultrasound.
2. Prostate is enlarged and slightly irregular with calcifications.
BPH, prostatitis, and/ or prostate malignancy could present in this
fashion .

## 2015-02-07 ENCOUNTER — Ambulatory Visit: Payer: BC Managed Care – PPO | Admitting: Medical

## 2016-02-29 ENCOUNTER — Ambulatory Visit: Payer: BC Managed Care – PPO | Admitting: Family Medicine

## 2016-04-06 ENCOUNTER — Ambulatory Visit (INDEPENDENT_AMBULATORY_CARE_PROVIDER_SITE_OTHER): Payer: BC Managed Care – PPO | Admitting: Medical

## 2016-04-06 ENCOUNTER — Encounter: Payer: Self-pay | Admitting: Medical

## 2016-04-06 VITALS — BP 104/74 | HR 62 | Temp 97.7°F | Ht 72.5 in | Wt 233.2 lb

## 2016-04-06 DIAGNOSIS — N400 Enlarged prostate without lower urinary tract symptoms: Secondary | ICD-10-CM | POA: Diagnosis not present

## 2016-04-06 DIAGNOSIS — Z7189 Other specified counseling: Secondary | ICD-10-CM

## 2016-04-06 DIAGNOSIS — Z1211 Encounter for screening for malignant neoplasm of colon: Secondary | ICD-10-CM

## 2016-04-06 DIAGNOSIS — J309 Allergic rhinitis, unspecified: Secondary | ICD-10-CM | POA: Diagnosis not present

## 2016-04-06 DIAGNOSIS — Z720 Tobacco use: Secondary | ICD-10-CM | POA: Diagnosis not present

## 2016-04-06 DIAGNOSIS — E669 Obesity, unspecified: Secondary | ICD-10-CM | POA: Insufficient documentation

## 2016-04-06 DIAGNOSIS — F17200 Nicotine dependence, unspecified, uncomplicated: Secondary | ICD-10-CM

## 2016-04-06 DIAGNOSIS — Z125 Encounter for screening for malignant neoplasm of prostate: Secondary | ICD-10-CM | POA: Diagnosis not present

## 2016-04-06 DIAGNOSIS — Z2820 Immunization not carried out because of patient decision for unspecified reason: Secondary | ICD-10-CM

## 2016-04-06 DIAGNOSIS — Z136 Encounter for screening for cardiovascular disorders: Secondary | ICD-10-CM | POA: Diagnosis not present

## 2016-04-06 DIAGNOSIS — Z7185 Encounter for immunization safety counseling: Secondary | ICD-10-CM | POA: Insufficient documentation

## 2016-04-06 DIAGNOSIS — Z1159 Encounter for screening for other viral diseases: Secondary | ICD-10-CM | POA: Diagnosis not present

## 2016-04-06 DIAGNOSIS — Z0000 Encounter for general adult medical examination without abnormal findings: Secondary | ICD-10-CM | POA: Diagnosis not present

## 2016-04-06 DIAGNOSIS — Z2821 Immunization not carried out because of patient refusal: Secondary | ICD-10-CM | POA: Insufficient documentation

## 2016-04-06 LAB — COMPREHENSIVE METABOLIC PANEL
ALBUMIN: 4.4 g/dL (ref 3.6–5.1)
ALT: 19 U/L (ref 9–46)
AST: 24 U/L (ref 10–35)
Alkaline Phosphatase: 47 U/L (ref 40–115)
BILIRUBIN TOTAL: 0.7 mg/dL (ref 0.2–1.2)
BUN: 16 mg/dL (ref 7–25)
CO2: 24 mmol/L (ref 20–31)
CREATININE: 1.57 mg/dL — AB (ref 0.70–1.33)
Calcium: 9.3 mg/dL (ref 8.6–10.3)
Chloride: 103 mmol/L (ref 98–110)
GLUCOSE: 86 mg/dL (ref 65–99)
Potassium: 4.3 mmol/L (ref 3.5–5.3)
SODIUM: 139 mmol/L (ref 135–146)
Total Protein: 7.1 g/dL (ref 6.1–8.1)

## 2016-04-06 LAB — LIPID PANEL
CHOL/HDL RATIO: 3.1 ratio (ref <=5.0)
Cholesterol: 146 mg/dL (ref 125–200)
HDL: 47 mg/dL (ref >=40)
LDL CALC: 88 mg/dL (ref <130)
Triglycerides: 56 mg/dL (ref <150)
VLDL: 11 mg/dL (ref <30)

## 2016-04-06 LAB — POC URINALSYSI DIPSTICK (AUTOMATED)
BILIRUBIN UA: NEGATIVE
Blood, UA: NEGATIVE
Glucose, UA: NEGATIVE
KETONES UA: NEGATIVE
Leukocytes, UA: NEGATIVE
Nitrite, UA: NEGATIVE
PROTEIN UA: NEGATIVE
SPEC GRAV UA: 1.01
Urobilinogen, UA: 4
pH, UA: 6

## 2016-04-06 LAB — CBC
HCT: 44.8 % (ref 38.5–50.0)
HEMOGLOBIN: 15.1 g/dL (ref 13.2–17.1)
MCH: 30.4 pg (ref 27.0–33.0)
MCHC: 33.7 g/dL (ref 32.0–36.0)
MCV: 90.3 fL (ref 80.0–100.0)
MPV: 10.5 fL (ref 7.5–12.5)
Platelets: 224 10*3/uL (ref 140–400)
RBC: 4.96 MIL/uL (ref 4.20–5.80)
RDW: 14.8 % (ref 11.0–15.0)
WBC: 6.7 10*3/uL (ref 4.0–10.5)

## 2016-04-06 LAB — PSA: PSA: 3 ng/mL (ref <=4.0)

## 2016-04-06 NOTE — Progress Notes (Signed)
Subjective:   HPI  Alexander Dean is a 50 y.o. male who presents for a complete physical.  Last visit 2015   Concerns: When running gets pain sometimes in RUQ.  Feels like a cramp.  Generally walks or runs for exercise, 3 days per week.  No pain after eating.  doesn't eat right before running.    No other concerns.    Reviewed their medical, surgical, family, social, medication, and allergy history and updated chart as appropriate.  Past Medical History:  Diagnosis Date  . Asthma    childhood  . BPH (benign prostatic hypertrophy)    per exam and PSA, asymptomatic  . Cigar smoker   . Seasonal allergic rhinitis   . Wears glasses     Past Surgical History:  Procedure Laterality Date  . APPENDECTOMY  1983  . WISDOM TOOTH EXTRACTION      Social History   Social History  . Marital status: Married    Spouse name: N/A  . Number of children: N/A  . Years of education: N/A   Occupational History  . Not on file.   Social History Main Topics  . Smoking status: Current Some Day Smoker    Types: Cigars  . Smokeless tobacco: Never Used     Comment: one cigar weekly  . Alcohol use 0.0 oz/week  . Drug use: No  . Sexual activity: Not on file   Other Topics Concern  . 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 Other Mother     renal stones  . Diabetes Mother   . Hypertension Mother   . Arthritis Father     knees  . Other Father     renal stones  . Cancer Neg Hx   . Hyperlipidemia Neg Hx      Current Outpatient Prescriptions:  .  DiphenhydrAMINE HCl ([facility] ALLERGY PO), Take by mouth., Disp: , Rfl:  .  Multiple Vitamin (MULTIVITAMIN) capsule, Take 1 capsule by mouth daily., Disp: , Rfl:   No Known Allergies   Review of Systems Constitutional: -fever, -chills, -sweats,  -unexpected weight change, -decreased appetite, -fatigue Allergy: -sneezing, -itching, -congestion Dermatology: -changing moles, --rash, -lumps ENT: -runny nose, -ear pain, -sore throat, -hoarseness, -sinus pain, -teeth pain, - ringing in ears, -hearing loss, -nosebleeds Cardiology: -chest pain, -palpitations, -swelling, -difficulty breathing when lying flat, -waking up short of breath Respiratory: -cough, -shortness of breath, -difficulty breathing with exercise or exertion, -wheezing, -coughing up blood Gastroenterology: +abdominal pain, -nausea, -vomiting, -diarrhea, -constipation, -blood in stool, -changes in bowel movement, -difficulty swallowing or eating Hematology: -bleeding, -bruising  Musculoskeletal: -joint aches(left hip area when running), -muscle aches, -joint swelling, -back pain, +neck pain(crick in his neck), -cramping, -changes in gait Ophthalmology: denies vision changes, eye redness, itching, discharge Urology: -burning with urination, -difficulty urinating, -blood in urine, -urinary frequency, -urgency, -incontinence Neurology: -headache, -weakness, -tingling, -numbness, -memory loss, -falls, -dizziness Psychology: -depressed mood, -agitation, -sleep problems     Objective:   Physical Exam  BP 104/74 (BP Location: Right Arm, Patient Position: Sitting, Cuff Size: Large)   Pulse 62   Temp 97.7 F (36.5 C) (Oral)   Ht 6' 0.5" (1.842 m)   Wt 233 lb 3.2 oz (105.8 kg)   SpO2 98%   BMI 31.19 kg/m  General appearance: alert, no distress, WD/WN, AA male Skin: right lower abdomen with 1.4 cm x 1.4cm squarish flat brown lesion c/w birth mark similar, small 1.5cm x 0.5cm rectangular flatbrown lesion of right lower back, few other scattered benign appearing macules, no other worrisome lesions HEENT: normocephalic, conjunctiva/corneas normal, sclerae anicteric, PERRLA, EOMi, nares patent, no discharge or erythema, pharynx normal Oral cavity: MMM, tongue normal, teethin good  repair Neck: supple, no lymphadenopathy, no thyromegaly, no masses, normal ROM, no bruits Chest: non tender, normal shape and expansion Heart: RRR, normal S1, S2, no murmurs Lungs: CTA bilaterally, no wheezes, rhonchi, or rales Abdomen: +bs, soft, RLQ surgical scar, non tender, non distended, no masses, no hepatomegaly, no splenomegaly, no bruits Back: non tender, normal ROM, no scoliosis Musculoskeletal: upper extremities non tender, no obvious deformity, normal ROM throughout, lower extremities non tender, no obvious deformity, normal ROM throughout Extremities: no edema, no cyanosis, no clubbing Pulses: 2+ symmetric, upper and lower extremities, normal cap refill Neurological: alert, oriented x 3, CN2-12 intact, strength normal upper extremities and lower extremities, sensation normal throughout, DTRs 2+ throughout, no cerebellar signs, gait normal Psychiatric: normal affect, behavior normal, pleasant  GU: normal male external genitalia, circumcised, nontender, no masses, no hernia, no lymphadenopathy Rectal: anus normal tone and appearance, prostate generally enlarged, no nodules, nontender, otherwise unremarkable   Adult ECG Report  Indication: physical  Rate: 49 bpm  Rhythm: sinus bradycardia  QRS Axis: 57 degrees  PR Interval: 188 ms  QRS Duration: 96ms  QTc: 457ms  Conduction Disturbances: none  Other Abnormalities: none  Patient's cardiac risk factors are: male gender and smoking/ tobacco exposure.  EKG comparison: none  Narrative Interpretation: sinus bradycardia   Assessment and Plan :    Encounter Diagnoses  Name Primary?  . Healthcare maintenance Yes  . BPH (benign prostatic hypertrophy)   . Smoking   . Allergic rhinitis, unspecified allergic rhinitis type   . Special screening for malignant neoplasms, colon   . Screening for prostate cancer   . Vaccine counseling   . Screening for heart disease   . Obesity   . Need for hepatitis C screening test   . Vaccine  refused by patient     Physical exam - discussed healthy lifestyle, diet, exercise, preventative care, vaccinations, and addressed their concerns.   See your eye doctor yearly for routine vision care. See your dentist yearly for routine dental care including hygiene visits twice yearly. referrral for first colonoscopy BPH - PSA lab today Smoking - advised he stop tobacco RUQ abdominal pain with exercise - f/u pending labs He declines flu and pneumonia vaccines today Follow-up pending labs   Alexander Dean was seen today for annual exam and abdominal pain.  Diagnoses and all orders for this visit:  Healthcare maintenance -     POCT Urinalysis Dipstick (Automated) -     Lipid panel -     Comprehensive metabolic panel -     CBC -     PSA -     Hemoglobin A1c -     EKG 12-Lead -     Ambulatory referral to Gastroenterology -     Hepatitis C antibody  BPH (benign prostatic hypertrophy) -     PSA  Smoking -     EKG 12-Lead  Allergic rhinitis, unspecified allergic rhinitis type  Special screening for malignant neoplasms, colon -     Ambulatory referral to Gastroenterology  Screening for prostate cancer -     PSA  Vaccine counseling  Screening for heart disease -     EKG 12-Lead  Obesity -     Hemoglobin A1c  Need for hepatitis C screening test  Vaccine refused by patient

## 2016-04-07 ENCOUNTER — Other Ambulatory Visit: Payer: Self-pay | Admitting: Medical

## 2016-04-07 DIAGNOSIS — R1011 Right upper quadrant pain: Secondary | ICD-10-CM

## 2016-04-07 DIAGNOSIS — R7989 Other specified abnormal findings of blood chemistry: Secondary | ICD-10-CM

## 2016-04-07 LAB — HEMOGLOBIN A1C
Hgb A1c MFr Bld: 5.8 % — ABNORMAL HIGH (ref <5.7)
Mean Plasma Glucose: 120 mg/dL

## 2016-04-07 LAB — HEPATITIS C ANTIBODY: HCV Ab: NEGATIVE

## 2016-04-09 ENCOUNTER — Encounter: Payer: Self-pay | Admitting: Gastroenterology

## 2016-04-20 ENCOUNTER — Ambulatory Visit
Admission: RE | Admit: 2016-04-20 | Discharge: 2016-04-20 | Disposition: A | Discharge status: Home / Self Care | Payer: BC Managed Care – PPO | Source: Ambulatory Visit | Attending: Medical | Admitting: Medical

## 2016-04-20 DIAGNOSIS — R7989 Other specified abnormal findings of blood chemistry: Secondary | ICD-10-CM

## 2016-04-20 DIAGNOSIS — R1011 Right upper quadrant pain: Secondary | ICD-10-CM

## 2016-05-23 ENCOUNTER — Ambulatory Visit (AMBULATORY_SURGERY_CENTER): Payer: Self-pay

## 2016-05-23 VITALS — Ht 73.0 in | Wt 233.4 lb

## 2016-05-23 DIAGNOSIS — Z1211 Encounter for screening for malignant neoplasm of colon: Secondary | ICD-10-CM

## 2016-05-23 MED ORDER — SUPREP BOWEL PREP KIT 17.5-3.13-1.6 GM/177ML PO SOLN
1.0000 | Freq: Once | ORAL | 0 refills | Status: AC
Start: 2016-05-23 — End: 2016-05-23

## 2016-05-23 NOTE — Progress Notes (Signed)
No allergies to eggs or soy No past problems with anesthesia No home oxygen No diet meds  Has email; registered emmi

## 2016-06-05 ENCOUNTER — Encounter: Payer: Self-pay | Admitting: Gastroenterology

## 2016-06-08 HISTORY — PX: COLONOSCOPY: SHX174

## 2016-06-13 ENCOUNTER — Ambulatory Visit (AMBULATORY_SURGERY_CENTER): Payer: BC Managed Care – PPO | Admitting: Gastroenterology

## 2016-06-13 ENCOUNTER — Encounter: Payer: Self-pay | Admitting: Gastroenterology

## 2016-06-13 VITALS — BP 105/65 | HR 69 | Temp 97.5°F | Resp 14 | Ht 73.0 in | Wt 233.0 lb

## 2016-06-13 DIAGNOSIS — D123 Benign neoplasm of transverse colon: Secondary | ICD-10-CM | POA: Diagnosis not present

## 2016-06-13 DIAGNOSIS — Z1211 Encounter for screening for malignant neoplasm of colon: Secondary | ICD-10-CM

## 2016-06-13 DIAGNOSIS — D122 Benign neoplasm of ascending colon: Secondary | ICD-10-CM | POA: Diagnosis not present

## 2016-06-13 DIAGNOSIS — Z1212 Encounter for screening for malignant neoplasm of rectum: Secondary | ICD-10-CM

## 2016-06-13 MED ORDER — SODIUM CHLORIDE 0.9 % IV SOLN: 500.0000 mL | INTRAVENOUS | Status: DC

## 2016-06-13 NOTE — Patient Instructions (Signed)
Colon polyps removed today. See handouts given on polyps,hemorrhoids. Result letter in your mail in 2-3 weeks. DO NOT take any ibuprofen,naproxen,aleve, or other non-steriodal antiinflammatory medications for 2 weeks! Call us with any questions or concerns. Thank you!   YOU HAD AN ENDOSCOPIC PROCEDURE TODAY AT [facility]:   Refer to the procedure report that was given to you for any specific questions about what was found during the examination.  If the procedure report does not answer your questions, please call your gastroenterologist to clarify.  If you requested that your care partner not be given the details of your procedure findings, then the procedure report has been included in a sealed envelope for you to review at your convenience later.  YOU SHOULD EXPECT: Some feelings of bloating in the abdomen. Passage of more gas than usual.  Walking can help get rid of the air that was put into your GI tract during the procedure and reduce the bloating. If you had a lower endoscopy (such as a colonoscopy or flexible sigmoidoscopy) you may notice spotting of blood in your stool or on the toilet paper. If you underwent a bowel prep for your procedure, you may not have a normal bowel movement for a few days.  Please Note:  You might notice some irritation and congestion in your nose or some drainage.  This is from the oxygen used during your procedure.  There is no need for concern and it should clear up in a day or so.  SYMPTOMS TO REPORT IMMEDIATELY:   Following lower endoscopy (colonoscopy or flexible sigmoidoscopy):  Excessive amounts of blood in the stool  Significant tenderness or worsening of abdominal pains  Swelling of the abdomen that is new, acute  Fever of 100F or higher  For urgent or emergent issues, a gastroenterologist can be reached at any hour by calling [phone].   DIET:  We do recommend a small meal at first, but then you may proceed to your regular  diet.  Drink plenty of fluids but you should avoid alcoholic beverages for 24 hours.  ACTIVITY:  You should plan to take it easy for the rest of today and you should NOT DRIVE or use heavy machinery until tomorrow (because of the sedation medicines used during the test).    FOLLOW UP: Our staff will call the number listed on your records the next business day following your procedure to check on you and address any questions or concerns that you may have regarding the information given to you following your procedure. If we do not reach you, we will leave a message.  However, if you are feeling well and you are not experiencing any problems, there is no need to return our call.  We will assume that you have returned to your regular daily activities without incident.  If any biopsies were taken you will be contacted by phone or by letter within the next 1-3 weeks.  Please call us at [phone] if you have not heard about the biopsies in 3 weeks.    SIGNATURES/CONFIDENTIALITY: You and/or your care partner have signed paperwork which will be entered into your electronic medical record.  These signatures attest to the fact that that the information above on your After Visit Summary has been reviewed and is understood.  Full responsibility of the confidentiality of this discharge information lies with you and/or your care-partner.

## 2016-06-13 NOTE — Progress Notes (Signed)
Called to room to assist during endoscopic procedure.  Patient ID and intended procedure confirmed with present staff. Received instructions for my participation in the procedure from the performing physician.  

## 2016-06-13 NOTE — Op Note (Signed)
[facility] Patient Name: Alexander Dean Procedure Date: 06/13/2016 9:35 AM MRN: [id number] Endoscopist: Remo Lipps P. Parthenia Tellefsen MD, MD Age: 50 Referring MD:  Date of Birth: Mar 12, 1966 Gender: Male Account #: [id number] Procedure:                Colonoscopy Indications:              Screening for malignant neoplasm in the colon, This                            is the patient's first colonoscopy Medicines:                Monitored Anesthesia Care Procedure:                Pre-Anesthesia Assessment:                           - Prior to the procedure, a History and Physical                            was performed, and patient medications and                            allergies were reviewed. The patient's tolerance of                            previous anesthesia was also reviewed. The risks                            and benefits of the procedure and the sedation                            options and risks were discussed with the patient.                            All questions were answered, and informed consent                            was obtained. Prior Anticoagulants: The patient has                            taken no previous anticoagulant or antiplatelet                            agents. ASA Grade Assessment: II - A patient with                            mild systemic disease. After reviewing the risks                            and benefits, the patient was deemed in                            satisfactory condition to undergo the procedure.  After obtaining informed consent, the colonoscope                            was passed under direct vision. Throughout the                            procedure, the patient's blood pressure, pulse, and                            oxygen saturations were monitored continuously. The                            Model CF-HQ190L [id number]) scope was introduced                            through the anus  and advanced to the the cecum,                            identified by appendiceal orifice and ileocecal                            valve. The colonoscopy was performed without                            difficulty. The patient tolerated the procedure                            well. The quality of the bowel preparation was                            good. The ileocecal valve, appendiceal orifice, and                            rectum were photographed. Scope In: 9:48:02 AM Scope Out: 10:04:53 AM Scope Withdrawal Time: 0 hours 14 minutes 45 seconds  Total Procedure Duration: 0 hours 16 minutes 51 seconds  Findings:                 The perianal and digital rectal examinations were                            normal.                           Two sessile polyps were found in the ascending                            colon. The polyps were 4 to 5 mm in size. These                            polyps were removed with a cold snare. Resection                            and retrieval were complete.  A 5 mm polyp was found in the hepatic flexure. The                            polyp was sessile. The polyp was removed with a                            cold snare. Resection and retrieval were complete.                           Internal hemorrhoids were found during retroflexion.                           The exam was otherwise without abnormality. Complications:            No immediate complications. Estimated blood loss:                            Minimal. Estimated Blood Loss:     Estimated blood loss was minimal. Impression:               - Two 4 to 5 mm polyps in the ascending colon,                            removed with a cold snare. Resected and retrieved.                           - One 5 mm polyp at the hepatic flexure, removed                            with a cold snare. Resected and retrieved.                           - Internal hemorrhoids.                            - The examination was otherwise normal. Recommendation:           - Patient has a contact number available for                            emergencies. The signs and symptoms of potential                            delayed complications were discussed with the                            patient. Return to normal activities tomorrow.                            Written discharge instructions were provided to the                            patient.                           -  Resume previous diet.                           - Continue present medications.                           - No ibuprofen, naproxen, or other non-steroidal                            anti-inflammatory drugs for 2 weeks after polyp                            removal.                           - Await pathology results.                           - Repeat colonoscopy is recommended for                            surveillance. The colonoscopy date will be                            determined after pathology results from today's                            exam become available for review. Remo Lipps P. Deverick Pruss MD, MD 06/13/2016 10:08:46 AM This report has been signed electronically.

## 2016-06-13 NOTE — Progress Notes (Signed)
A and O x3. Report to RN. Tolerated MAC anesthesia well. 

## 2016-06-14 ENCOUNTER — Telehealth: Payer: Self-pay | Admitting: *Deleted

## 2016-06-14 NOTE — Telephone Encounter (Signed)
  Follow up Call-  Call back number 06/13/2016  Post procedure Call Back phone  # [phone]  Permission to leave phone message Yes  Some recent data might be hidden     Patient questions:  Do you have a fever, pain , or abdominal swelling? No. Pain Score  0 *  Have you tolerated food without any problems? yes  Have you been able to return to your normal activities? Yes.    Do you have any questions about your discharge instructions: Diet   No. Medications  No. Follow up visit  No.  Do you have questions or concerns about your Care? No.  Actions: * If pain score is 4 or above: No action needed, pain <4.

## 2016-06-20 ENCOUNTER — Encounter: Payer: Self-pay | Admitting: Gastroenterology

## 2017-04-11 ENCOUNTER — Encounter: Payer: Self-pay | Admitting: Medical

## 2017-04-11 ENCOUNTER — Ambulatory Visit (INDEPENDENT_AMBULATORY_CARE_PROVIDER_SITE_OTHER): Payer: BC Managed Care – PPO | Admitting: Medical

## 2017-04-11 VITALS — BP 124/80 | HR 65 | Ht 72.5 in | Wt 230.6 lb

## 2017-04-11 DIAGNOSIS — R7989 Other specified abnormal findings of blood chemistry: Secondary | ICD-10-CM | POA: Diagnosis not present

## 2017-04-11 DIAGNOSIS — M25561 Pain in right knee: Secondary | ICD-10-CM | POA: Diagnosis not present

## 2017-04-11 DIAGNOSIS — N400 Enlarged prostate without lower urinary tract symptoms: Secondary | ICD-10-CM | POA: Diagnosis not present

## 2017-04-11 DIAGNOSIS — Z0000 Encounter for general adult medical examination without abnormal findings: Secondary | ICD-10-CM

## 2017-04-11 DIAGNOSIS — Z2820 Immunization not carried out because of patient decision for unspecified reason: Secondary | ICD-10-CM

## 2017-04-11 DIAGNOSIS — Z7189 Other specified counseling: Secondary | ICD-10-CM

## 2017-04-11 DIAGNOSIS — M6289 Other specified disorders of muscle: Secondary | ICD-10-CM | POA: Insufficient documentation

## 2017-04-11 DIAGNOSIS — F17200 Nicotine dependence, unspecified, uncomplicated: Secondary | ICD-10-CM | POA: Diagnosis not present

## 2017-04-11 DIAGNOSIS — Z7185 Encounter for immunization safety counseling: Secondary | ICD-10-CM

## 2017-04-11 DIAGNOSIS — G8929 Other chronic pain: Secondary | ICD-10-CM | POA: Diagnosis not present

## 2017-04-11 DIAGNOSIS — J301 Allergic rhinitis due to pollen: Secondary | ICD-10-CM | POA: Diagnosis not present

## 2017-04-11 LAB — POCT URINALYSIS DIP (PROADVANTAGE DEVICE)
Bilirubin, UA: NEGATIVE
GLUCOSE UA: NEGATIVE mg/dL
Ketones, POC UA: NEGATIVE mg/dL
LEUKOCYTES UA: NEGATIVE
NITRITE UA: NEGATIVE
PROTEIN UA: NEGATIVE mg/dL
SPECIFIC GRAVITY, URINE: 1.015
UUROB: NEGATIVE
pH, UA: 6 (ref 5.0–8.0)

## 2017-04-11 NOTE — Progress Notes (Signed)
Subjective:   HPI  Alexander Dean is a 51 y.o. male who presents for a complete physical.    Concerns: Knee tightness and swelling right knee x few moths.   He does officiate football, plays some basketball.   May have twisted knee at some point, but no specific trauma or injury.  No other concerns.    Reviewed their medical, surgical, family, social, medication, and allergy history and updated chart as appropriate.  Past Medical History:  Diagnosis Date  . Asthma    childhood  . BPH (benign prostatic hypertrophy)    per exam and PSA, asymptomatic  . Cigar smoker   . Environmental allergies   . Seasonal allergic rhinitis   . Wears glasses     Past Surgical History:  Procedure Laterality Date  . APPENDECTOMY  1983  . COLONOSCOPY  06/2016   tubular adenoma, repeat 3 years, Dr. Havery Moros  . WISDOM TOOTH EXTRACTION      Social History   Social History  . Marital status: Married    Spouse name: N/A  . Number of children: N/A  . Years of education: N/A   Occupational History  . Not on file.   Social History Main Topics  . Smoking status: Current Some Day Smoker    Types: Cigars  . Smokeless tobacco: Never Used     Comment: one cigar weekly  . Alcohol use 0.0 oz/week     Comment: once monthly  . Drug use: No  . Sexual activity: Not on file   Other Topics Concern  . 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   04/2017    Family History  Problem Relation Age of Onset  . Other Mother        renal stones  . Diabetes Mother   . Hypertension Mother   . Arthritis Father        knees  . Other Father        renal stones  . Cancer Neg Hx   . Hyperlipidemia Neg Hx   . Colon cancer Neg Hx      Current Outpatient Prescriptions:  .  DiphenhydrAMINE HCl ([facility] ALLERGY PO), Take by  mouth., Disp: , Rfl:  .  Multiple Vitamin (MULTIVITAMIN) capsule, Take 1 capsule by mouth daily., Disp: , Rfl:   Current Facility-Administered Medications:  .  0.9 %  sodium chloride infusion, 500 mL, Intravenous, Continuous, Armbruster, Carlota Raspberry, MD  Allergies  Allergen Reactions  . Other Anaphylaxis    peanuts  . Shellfish Allergy Anaphylaxis     Review of Systems Constitutional: -fever, -chills, -sweats, -unexpected weight change, -decreased appetite, -fatigue Allergy: -sneezing, -itching, -congestion Dermatology: -changing moles, --rash, -lumps ENT: -runny nose, -ear pain, -sore throat, -hoarseness, -sinus pain, -teeth pain, - ringing in ears, -hearing loss, -nosebleeds Cardiology: -chest pain, -palpitations, -swelling, -difficulty breathing when lying flat, -waking up short of breath Respiratory: -cough, -shortness of breath, -difficulty breathing with exercise or exertion, -wheezing, -coughing up blood Gastroenterology: -abdominal pain, -nausea, -vomiting, -diarrhea, -constipation, -blood in stool, -changes in bowel movement, -difficulty swallowing or eating Hematology: -bleeding, -bruising  Musculoskeletal: +right knee tightness, pain, swelling, otherwise -muscle aches, -joint swelling, -back pain, -cramping, -changes in gait Ophthalmology: denies vision changes, eye redness, itching, discharge Urology: -burning with urination, -difficulty urinating, -blood in  urine, -urinary frequency, -urgency, -incontinence Neurology: -headache, -weakness, -tingling, -numbness, -memory loss, -falls, -dizziness Psychology: -depressed mood, -agitation, -sleep problems     Objective:   Physical Exam  BP 124/80   Pulse 65   Ht 6' 0.5" (1.842 m)   Wt 230 lb 9.6 oz (104.6 kg)   SpO2 97%   BMI 30.85 kg/m   General appearance: alert, no distress, WD/WN, AA male Skin: right lower abdomen with 1.4 cm x 1.4cm squarish flat brown lesion c/w birth mark similar, small 1.5cm x 0.5cm rectangular  flatbrown lesion of right lower back, few other scattered benign appearing macules, no other worrisome lesions HEENT: normocephalic, conjunctiva/corneas normal, sclerae anicteric, PERRLA, EOMi, nares patent, no discharge or erythema, pharynx normal Oral cavity: MMM, tongue normal, teethin good repair Neck: supple, no lymphadenopathy, no thyromegaly, no masses, normal ROM, no bruits Chest: non tender, normal shape and expansion Heart: RRR, normal S1, S2, no murmurs Lungs: CTA bilaterally, no wheezes, rhonchi, or rales Abdomen: +bs, soft, RLQ surgical scar, non tender, non distended, no masses, no hepatomegaly, no splenomegaly, no bruits Back: non tender, normal ROM, no scoliosis Musculoskeletal: tight hamstrings, particularly on the right, otherwise knee non tender, no deformity , no swelling, otherwise upper extremities non tender, no obvious deformity, normal ROM throughout, lower extremities non tender, no obvious deformity, normal ROM throughout Extremities: no edema, no cyanosis, no clubbing Pulses: 2+ symmetric, upper and lower extremities, normal cap refill Neurological: alert, oriented x 3, CN2-12 intact, strength normal upper extremities and lower extremities, sensation normal throughout, DTRs 2+ throughout, no cerebellar signs, gait normal Psychiatric: normal affect, behavior normal, pleasant  GU: normal male external genitalia, circumcised, non tender, no masses, no hernia, no lymphadenopathy Rectal: deferred   Assessment and Plan :    Encounter Diagnoses  Name Primary?  . Routine general medical examination at a health care facility Yes  . Allergic rhinitis due to pollen, unspecified seasonality   . Smoking   . Vaccine counseling   . Vaccine refused by patient   . BPH without urinary obstruction   . Creatinine elevation   . Chronic pain of right knee   . Hamstring tightness     Physical exam - discussed healthy lifestyle, diet, exercise, preventative care, vaccinations,  and addressed their concerns.   See your eye doctor yearly for routine vision care. See your dentist yearly for routine dental care including hygiene visits twice yearly. BPH without symptoms - labs today Smoking - advised he stop tobacco Counseled on vaccines, he will check on shingrix and he declines flu vaccine today Discussed stretching of legs, knees, and acute therapy for knee pain.   consider xray if this worsens. Creatinine elevation - labs today, etiology unclear. Follow-up pending labs   Prentis was seen today for annual exam.  Diagnoses and all orders for this visit:  Routine general medical examination at a health care facility -     POCT Urinalysis DIP ([employer] Device) -     Comprehensive metabolic panel -     PSA -     Hemoglobin A1c  Allergic rhinitis due to pollen, unspecified seasonality  Smoking  Vaccine counseling  Vaccine refused by patient  BPH without urinary obstruction -     PSA  Creatinine elevation -     Comprehensive metabolic panel  Chronic pain of right knee  Hamstring tightness

## 2017-04-11 NOTE — Patient Instructions (Addendum)
Recommendations:  See your eye doctor yearly for routine vision care.  See your dentist yearly for routine dental care including hygiene visits twice yearly.  Work on stretching of your legs and hamstring  I recommend you have a shingles vaccine to help prevent shingles or herpes zoster outbreak.   Please call your insurer to inquire about coverage for the Shingrix vaccine given in 2 doses.   Some insurers cover this vaccine after age 52, some cover this after age 41.  If your insurer covers this, then call to schedule appointment to have this vaccine here.  Serum Creatinine Test Why am I having this test? A creatinine test is performed to measure the amount of creatinine in your blood (serum). Creatinine is a waste product of normal muscle activity (contraction). The kidneys filter creatinine from your blood and remove it from your body through urination. Blood creatinine levels stay consistent in people whose muscle mass stays consistent. This level can be increased in people who perform resistance exercise to increase muscle mass. Because creatinine is removed from your body by the kidneys, this test is often used as a way to measure kidney function. Your health care provider may recommend this test if he or she suspects that you have a condition that is negatively affecting your kidney function. This test may also be done as a part of routine blood work used to assess your overall health. What kind of sample is taken? A blood sample is required for this test. It is usually collected by inserting a needle into a vein or by sticking a finger with a small needle. For children, the blood sample is usually collected by sticking the child's heel with a small needle. How do I prepare for this test? There is no preparation or fasting required for this test. What are the reference ranges? Reference ranges are considered healthy ranges established after testing a large group of healthy people. Reference  ranges may vary among different people, labs, and hospitals. It is your responsibility to obtain your test results. Ask the lab or department performing the test when and how you will get your results.  Children or adolescents: ? Less than 49 years old: 0.1-0.4 mg/dL. ? 21-41 years old: 0.2-0.5 mg/dL. ? 71-107 years old: 0.3-0.6 mg/dL. ? 72-77 years old: 0.4-1 mg/dL.  Adult male: ? 72-18 years old: 0.5-1 mg/dL. ? 17-70 years old: 0.5-1.1 mg/dL. ? 61 years and older: 0.5-1.2 mg/dL.  Adult male: ? 59-2 years old: 0.6-1.2 mg/dL. ? 41-58 years old: 0.6-1.3 mg/dL. ? 61 years and older: 0.7-1.3 mg/dL.  The reference range may be higher in people who perform resistance exercise to increase muscle mass. What do the results mean? Abnormally high levels of serum creatinine can be caused by many health conditions. These may include:  Kidney disease.  Urinary tract obstruction.  Lower-than-normal blood flow to the kidneys.  Rhabdomyolysis. This occurs when muscle damage causes the release of molecules into the bloodstream, which results in kidney damage.  Acromegaly. This is a condition that causes enlarged bones.  Gigantism.  Abnormally low levels of serum creatinine can also be caused by many health conditions. These may include:  Conditions that cause you to be inactive throughout much of the day.  Decreased muscle mass.  Talk with your health care provider to discuss your results, treatment options, and if necessary, the need for more tests. Talk with your health care provider if you have any questions about your results. Talk with your health care  provider to discuss your results, treatment options, and if necessary, the need for more tests. Talk with your health care provider if you have any questions about your results. This information is not intended to replace advice given to you by your health care provider. Make sure you discuss any questions you have with your health care  provider. Document Released: 07/18/2004 Document Revised: 02/18/2016 Document Reviewed: 11/19/2013 Elsevier Interactive Patient Education  [employer].

## 2017-04-12 LAB — COMPREHENSIVE METABOLIC PANEL
AG Ratio: 1.5 (calc) (ref 1.0–2.5)
ALKALINE PHOSPHATASE (APISO): 52 U/L (ref 40–115)
ALT: 18 U/L (ref 9–46)
AST: 25 U/L (ref 10–35)
Albumin: 4.3 g/dL (ref 3.6–5.1)
BUN/Creatinine Ratio: 14 (calc) (ref 6–22)
BUN: 21 mg/dL (ref 7–25)
CHLORIDE: 104 mmol/L (ref 98–110)
CO2: 25 mmol/L (ref 20–32)
CREATININE: 1.47 mg/dL — AB (ref 0.70–1.33)
Calcium: 9.3 mg/dL (ref 8.6–10.3)
GLOBULIN: 2.8 g/dL (ref 1.9–3.7)
GLUCOSE: 98 mg/dL (ref 65–99)
Potassium: 4.4 mmol/L (ref 3.5–5.3)
Sodium: 137 mmol/L (ref 135–146)
Total Bilirubin: 0.6 mg/dL (ref 0.2–1.2)
Total Protein: 7.1 g/dL (ref 6.1–8.1)

## 2017-04-12 LAB — HEMOGLOBIN A1C
Hgb A1c MFr Bld: 5.9 % of total Hgb — ABNORMAL HIGH (ref <5.7)
Mean Plasma Glucose: 123 (calc)
eAG (mmol/L): 6.8 (calc)

## 2017-04-12 LAB — PSA: PSA: 2.6 ng/mL (ref <=4.0)

## 2017-04-15 ENCOUNTER — Other Ambulatory Visit: Payer: Self-pay | Admitting: Medical

## 2017-04-15 DIAGNOSIS — R7989 Other specified abnormal findings of blood chemistry: Secondary | ICD-10-CM

## 2017-04-18 ENCOUNTER — Other Ambulatory Visit (INDEPENDENT_AMBULATORY_CARE_PROVIDER_SITE_OTHER): Payer: BC Managed Care – PPO

## 2017-04-18 DIAGNOSIS — R8290 Unspecified abnormal findings in urine: Secondary | ICD-10-CM | POA: Diagnosis not present

## 2017-04-18 LAB — POCT URINALYSIS DIP (PROADVANTAGE DEVICE)
BILIRUBIN UA: NEGATIVE
GLUCOSE UA: NEGATIVE mg/dL
Ketones, POC UA: NEGATIVE mg/dL
Leukocytes, UA: NEGATIVE
NITRITE UA: NEGATIVE
Protein Ur, POC: NEGATIVE mg/dL
RBC UA: NEGATIVE
Specific Gravity, Urine: 1.01
UUROB: NEGATIVE
pH, UA: 6.5 (ref 5.0–8.0)

## 2017-04-24 ENCOUNTER — Telehealth: Payer: Self-pay

## 2017-04-24 NOTE — Telephone Encounter (Signed)
I guess I didn't realize he had seen them in 2016 for the same.    I read back over their notes.   So at this time, we will do yearly f/u on the kidney marker.   They felt it was due to his muscle mass/body habitus then and not a true problem such as kidney damage.    Let him know they reviewed his labs and didn't feel he needed f/u at this time.    F/u yearly for physical

## 2017-04-24 NOTE — Telephone Encounter (Signed)
Alexander Dean with [facility] called and said that the doctor that seen last time said that there has not been much   Change since the last time he was seen by the them. He was on PRN base. That this can be handle by you, unless that is specific reason that you need him to be seen by them. That his kidney are stable at the time.

## 2017-04-24 NOTE — Telephone Encounter (Signed)
Called an l/m for [facility] to call us back.

## 2017-09-24 IMAGING — US US ABDOMEN COMPLETE
1 series · 14 of 25 positions shown · non-contrast
Comparison: None.

CLINICAL DATA: Right upper quadrant pain with exercise for 2
months. Elevated serum creatinine

EXAM:
ABDOMEN ULTRASOUND COMPLETE

[Series 1: us abdomen complete · 0.22mm/px · 14 of 75 slices shown]
[im 1/75]
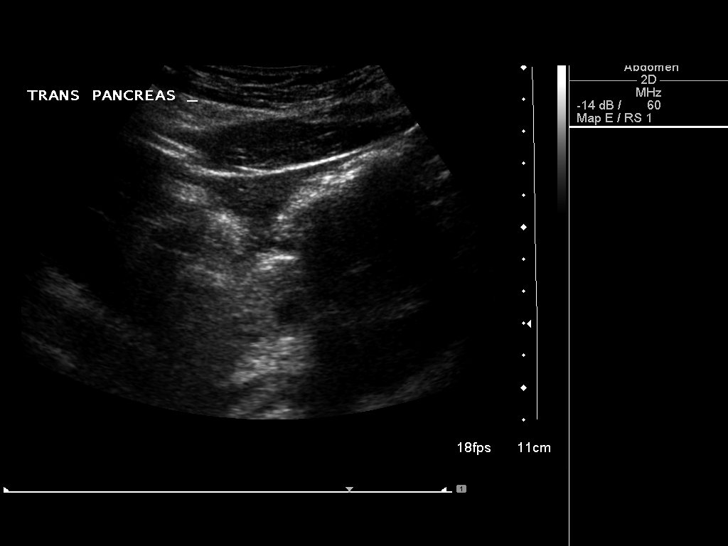
[im 7/75]
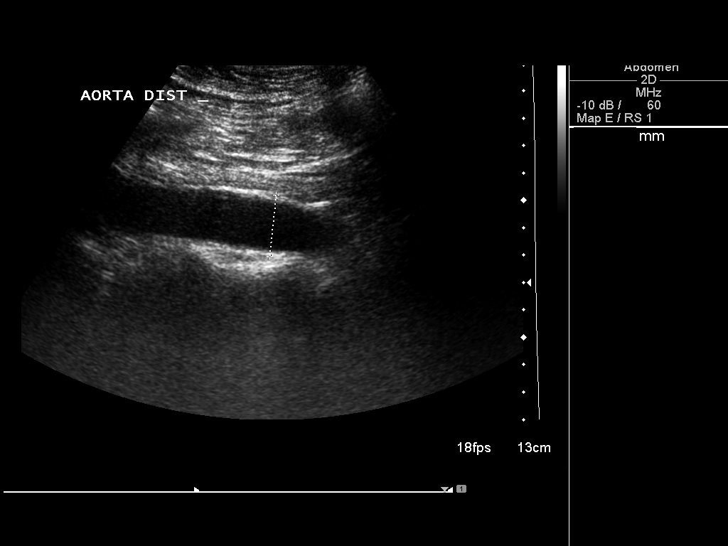
[im 13/75]
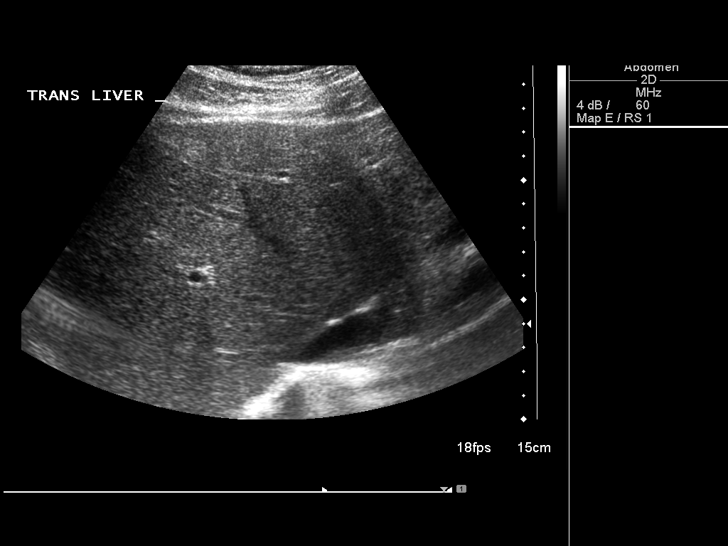
[im 19/75]
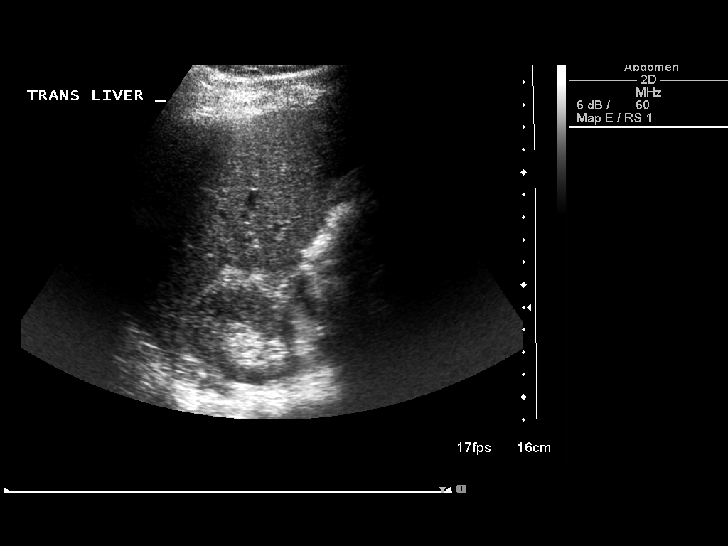
[im 25/75]
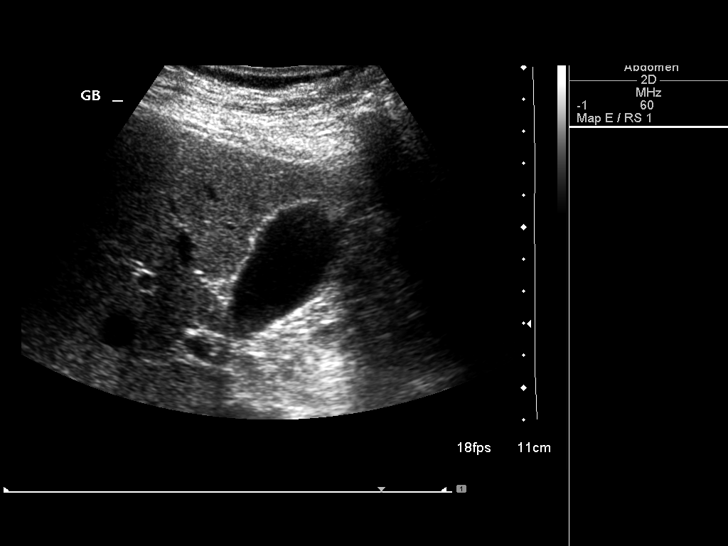
[im 28/75]
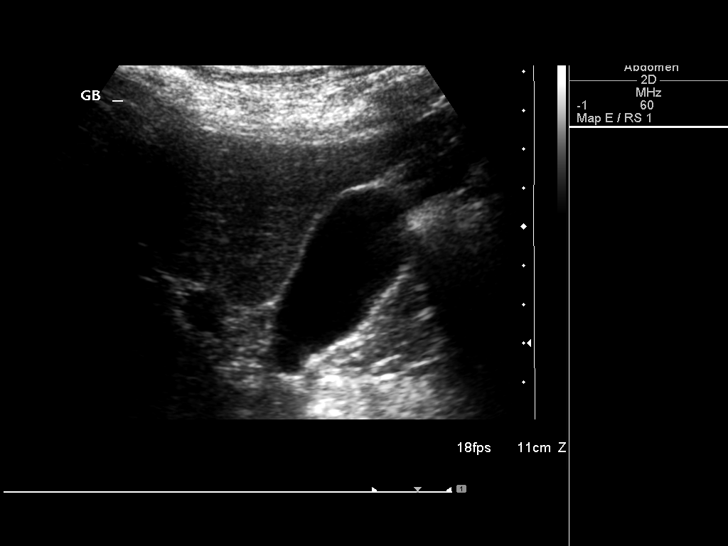
[im 34/75]
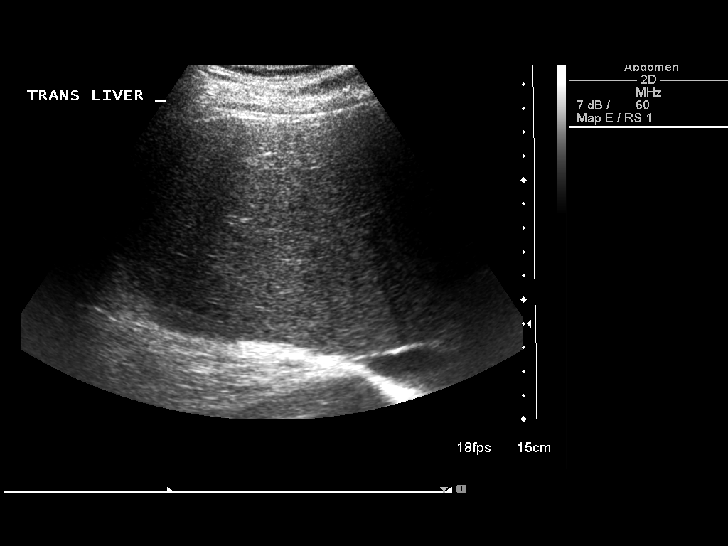
[im 41/75]
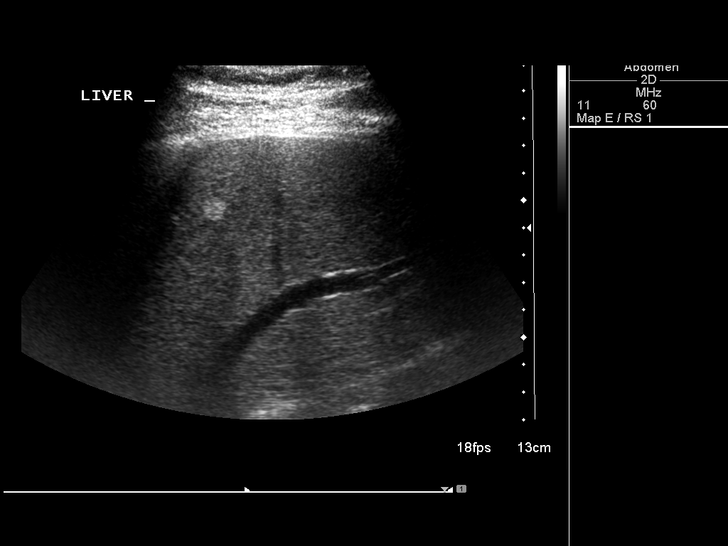
[im 47/75]
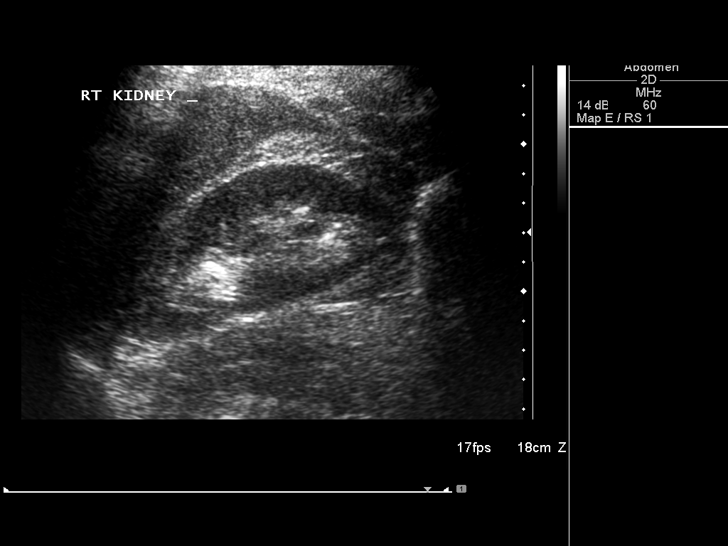
[im 50/75]
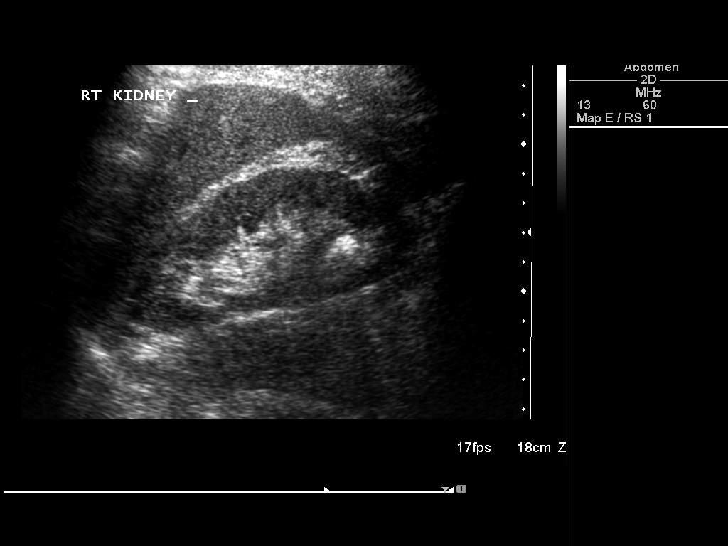
[im 56/75]
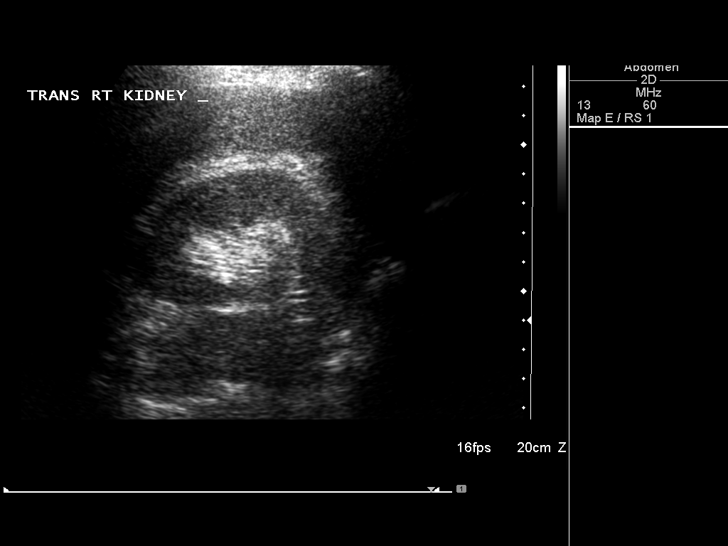
[im 62/75]
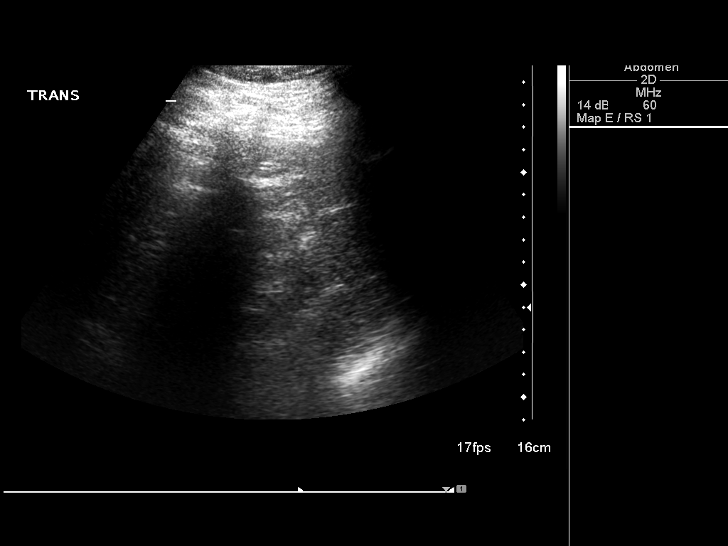
[im 68/75]
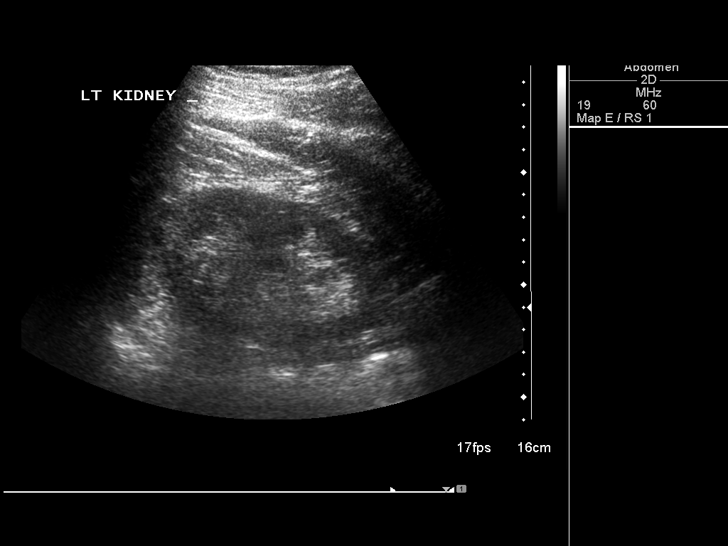
[im 75/75]
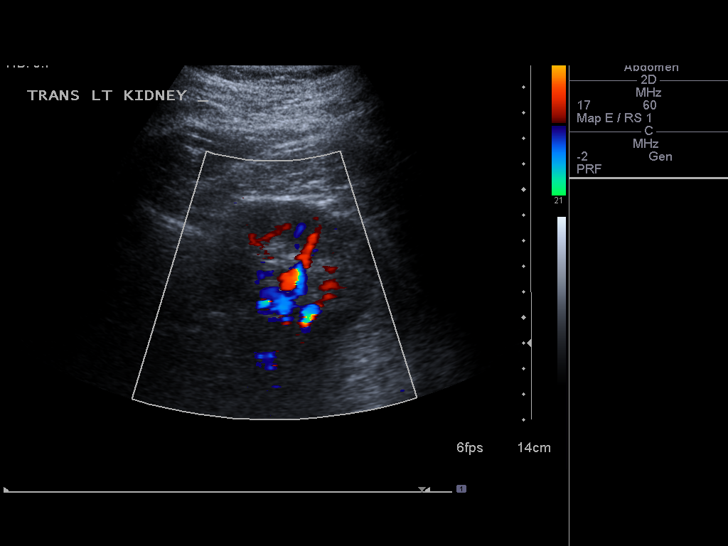

[14 of 25 positions shown; findings below may reference images not displayed]

FINDINGS: Gallbladder: No gallstones or wall thickening visualized. No
sonographic Murphy sign noted by sonographer.

Common bile duct: Diameter: 2.5 mm. Limited visualization due to
overlying bowel gas.

Liver: Well circumscribed uniformly hyperechoic lesion within the
right liver lobe measures 9 x 8 mm, consistent with benign
hemangioma. No other focal mass or lesion identified within the
liver. Overall echogenicity of the liver is normal.

IVC: No abnormality visualized.

Pancreas: Obscured by overlying bowel.

Spleen: Size and appearance within normal limits.

Right Kidney: Length: 10.6 cm. Echogenicity within normal limits. No
mass or hydronephrosis visualized.

Left Kidney: Length: 12 cm. Echogenicity within normal limits. No
mass or hydronephrosis visualized.

Abdominal aorta: No aneurysm visualized.

Other findings: None.
IMPRESSION: No acute findings. Pancreas and a portion of the CBD is obscured by
overlying bowel. Incidental note made of a small benign hemangioma
within the liver.

## 2018-04-23 ENCOUNTER — Ambulatory Visit: Payer: BC Managed Care – PPO | Admitting: Medical

## 2018-04-23 ENCOUNTER — Encounter: Payer: Self-pay | Admitting: Medical

## 2018-04-23 VITALS — BP 122/70 | HR 54 | Temp 97.6°F | Resp 16 | Ht 73.0 in | Wt 229.8 lb

## 2018-04-23 DIAGNOSIS — Z7189 Other specified counseling: Secondary | ICD-10-CM

## 2018-04-23 DIAGNOSIS — N400 Enlarged prostate without lower urinary tract symptoms: Secondary | ICD-10-CM | POA: Diagnosis not present

## 2018-04-23 DIAGNOSIS — J4520 Mild intermittent asthma, uncomplicated: Secondary | ICD-10-CM | POA: Insufficient documentation

## 2018-04-23 DIAGNOSIS — K4090 Unilateral inguinal hernia, without obstruction or gangrene, not specified as recurrent: Secondary | ICD-10-CM

## 2018-04-23 DIAGNOSIS — R7989 Other specified abnormal findings of blood chemistry: Secondary | ICD-10-CM

## 2018-04-23 DIAGNOSIS — R0689 Other abnormalities of breathing: Secondary | ICD-10-CM

## 2018-04-23 DIAGNOSIS — J301 Allergic rhinitis due to pollen: Secondary | ICD-10-CM

## 2018-04-23 DIAGNOSIS — Z0000 Encounter for general adult medical examination without abnormal findings: Secondary | ICD-10-CM | POA: Diagnosis not present

## 2018-04-23 DIAGNOSIS — F17200 Nicotine dependence, unspecified, uncomplicated: Secondary | ICD-10-CM

## 2018-04-23 DIAGNOSIS — R942 Abnormal results of pulmonary function studies: Secondary | ICD-10-CM | POA: Insufficient documentation

## 2018-04-23 DIAGNOSIS — Z7185 Encounter for immunization safety counseling: Secondary | ICD-10-CM

## 2018-04-23 DIAGNOSIS — R7301 Impaired fasting glucose: Secondary | ICD-10-CM

## 2018-04-23 LAB — POCT UA - MICROSCOPIC ONLY

## 2018-04-23 LAB — POCT URINALYSIS DIP (PROADVANTAGE DEVICE)
Bilirubin, UA: NEGATIVE
Glucose, UA: NEGATIVE mg/dL
Ketones, POC UA: NEGATIVE mg/dL
Leukocytes, UA: NEGATIVE
NITRITE UA: NEGATIVE
PROTEIN UA: NEGATIVE mg/dL
SPECIFIC GRAVITY, URINE: 1.01
UUROB: NEGATIVE
pH, UA: 6 (ref 5.0–8.0)

## 2018-04-23 NOTE — Progress Notes (Signed)
Subjective:   HPI  Alexander Dean is a 52 y.o. male who presents for Chief Complaint  Patient presents with  . CPE    fasting CPE  eye exam 2 months ago    Medical care team includes: Tysinger, Camelia Eng, PA-C here for primary care Dentist Eye doctor  Concerns: Swelling left inguinal region recently.  No recent injury or trauma.  No prior similar  History of childhood asthma, smokes cigars.  Been smoking 20 years at least.  Used to smoke cigarettes in the past.  Denies shortness of breath, wheezing, cough  He is leaving to go on vacation to [place] this weekend, declines flu and pneumonia shot  Reviewed their medical, surgical, family, social, medication, and allergy history and updated chart as appropriate.  Past Medical History:  Diagnosis Date  . Asthma    childhood  . BPH (benign prostatic hypertrophy)    per exam and PSA, asymptomatic  . Cigar smoker   . Environmental allergies   . Seasonal allergic rhinitis   . Wears glasses     Past Surgical History:  Procedure Laterality Date  . APPENDECTOMY  1983  . COLONOSCOPY  06/2016   tubular adenoma, repeat 3 years, Dr. Havery Moros  . WISDOM TOOTH EXTRACTION      Social History   Socioeconomic History  . Marital status: Married    Spouse name: Not on file  . Number of children: Not on file  . Years of education: Not on file  . Highest education level: Not on file  Occupational History  . Not on file  Social Needs  . Financial resource strain: Not on file  . Food insecurity:    Worry: Not on file    Inability: Not on file  . Transportation needs:    Medical: Not on file    Non-medical: Not on file  Tobacco Use  . Smoking status: Current Some Day Smoker    Types: Cigars  . Smokeless tobacco: Never Used  . Tobacco comment: one cigar weekly  Substance and Sexual Activity  . Alcohol use: Yes    Alcohol/week: 0.0 standard drinks    Comment: once monthly  . Drug use: No  . Sexual activity: Not on file   Lifestyle  . Physical activity:    Days per week: Not on file    Minutes per session: Not on file  . Stress: Not on file  Relationships  . Social connections:    Talks on phone: Not on file    Gets together: Not on file    Attends religious service: Not on file    Active member of club or organization: Not on file    Attends meetings of clubs or organizations: Not on file    Relationship status: Not on file  . Intimate partner violence:    Fear of current or ex partner: Not on file    Emotionally abused: Not on file    Physically abused: Not on file    Forced sexual activity: Not on file  Other Topics Concern  . 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   04/2018    Family History  Problem Relation Age of Onset  . Other Mother        renal stones  . Diabetes Mother   .  Hypertension Mother   . Arthritis Father        knees  . Other Father        renal stones  . Cancer Neg Hx   . Hyperlipidemia Neg Hx   . Colon cancer Neg Hx      Current Outpatient Medications:  .  DiphenhydrAMINE HCl ([facility] ALLERGY PO), Take by mouth., Disp: , Rfl:  .  Multiple Vitamin (MULTIVITAMIN) capsule, Take 1 capsule by mouth daily., Disp: , Rfl:   Current Facility-Administered Medications:  .  0.9 %  sodium chloride infusion, 500 mL, Intravenous, Continuous, Armbruster, Carlota Raspberry, MD  Allergies  Allergen Reactions  . Other Anaphylaxis    peanuts  . Shellfish Allergy Anaphylaxis     Review of Systems Constitutional: -fever, -chills, -sweats, -unexpected weight change, -decreased appetite, -fatigue Allergy: -sneezing, -itching, -congestion Dermatology: -changing moles, --rash, -lumps ENT: -runny nose, -ear pain, -sore throat, -hoarseness, -sinus pain, -teeth pain, - ringing in ears, -hearing loss,  -nosebleeds Cardiology: -chest pain, -palpitations, -swelling, -difficulty breathing when lying flat, -waking up short of breath Respiratory: -cough, -shortness of breath, -difficulty breathing with exercise or exertion, -wheezing, -coughing up blood Gastroenterology: -abdominal pain, -nausea, -vomiting, -diarrhea, -constipation, -blood in stool, -changes in bowel movement, -difficulty swallowing or eating Hematology: -bleeding, -bruising  Musculoskeletal: -joint aches, -muscle aches, -joint swelling, -back pain, -neck pain, -cramping, -changes in gait Ophthalmology: denies vision changes, eye redness, itching, discharge Urology: -burning with urination, -difficulty urinating, -blood in urine, -urinary frequency, -urgency, -incontinence Neurology: -headache, -weakness, -tingling, -numbness, -memory loss, -falls, -dizziness Psychology: -depressed mood, -agitation, -sleep problems Male GU: no testicular mass, pain, no lymph nodes swollen, no swelling, no rash.     Objective:  BP 122/70   Pulse (!) 54   Temp 97.6 F (36.4 C) (Oral)   Resp 16   Ht 6\' 1"  (1.854 m)   Wt 229 lb 12.8 oz (104.2 kg)   SpO2 98%   BMI 30.32 kg/m   General appearance: alert, no distress, WD/WN, African American male Skin: scattered macules , no worrisome lesions HEENT: normocephalic, conjunctiva/corneas normal, sclerae anicteric, PERRLA, EOMi, nares patent, no discharge or erythema, pharynx normal Oral cavity: MMM, tongue normal, teeth in good repair Neck: supple, no lymphadenopathy, no thyromegaly, no masses, normal ROM, no bruits Chest: non tender, normal shape and expansion Heart: RRR, normal S1, S2, no murmurs Lungs: CTA bilaterally, no wheezes, rhonchi, or rales Abdomen: +bs, soft, RLQ surgical scar, non tender, non distended, no masses, no hepatomegaly, no splenomegaly, no bruits Back: non tender, normal ROM, no scoliosis Musculoskeletal: upper extremities non tender, no obvious deformity, normal ROM  throughout, lower extremities non tender, no obvious deformity, normal ROM throughout Extremities: no edema, no cyanosis, no clubbing Pulses: 2+ symmetric, upper and lower extremities, normal cap refill Neurological: alert, oriented x 3, CN2-12 intact, strength normal upper extremities and lower extremities, sensation normal throughout, DTRs 2+ throughout, no cerebellar signs, gait normal Psychiatric: normal affect, behavior normal, pleasant  GU: normal male external genitalia,circumcised, nontender, no masses, moderate size reducible nontender left inguinal hernia, no lymphadenopathy Rectal: normal tone, prostate mildly enlarged, no nodules  Assessment and Plan :   Encounter Diagnoses  Name Primary?  . Routine general medical examination at a health care facility Yes  . Non-recurrent unilateral inguinal hernia without obstruction or gangrene   . Allergic rhinitis due to pollen, unspecified seasonality   . BPH without urinary obstruction   . Creatinine elevation   . Smoking   . Vaccine counseling   .  Decreased lung sounds   . Impaired fasting blood sugar   . Mild intermittent asthma, unspecified whether complicated   . Abnormal PFT     Physical exam - discussed and counseled on healthy lifestyle, diet, exercise, preventative care, vaccinations, sick and well care, proper use of emergency dept and after hours care, and addressed their concerns.    Health screening: See your eye doctor yearly for routine vision care. See your dentist yearly for routine dental care including hygiene visits twice yearly.  Cancer screening Reviewed colonoscopy on file that is up to date  Discussed PSA, prostate exam, and prostate cancer screening risks/benefits.     Consider CT chest if insurance covers this   Vaccinations: Advised yearly influenza vaccine Patient declines influenza vaccine  Counseled on Pneumococcal 23 vaccine Patient declines pneumococcal vaccine  Counseled on Shingles  vaccine at age 84 years and older Patient will check insurance coverage  Acute issues discussed: New left inguinal hernia reducible, nontender- given the size of this hernia advised he go ahead and consider surgical repair.  He will call back and let me know about referral  Separate significant chronic issues discussed: tobacco use-advised cessation, consider medication and counseling.  He will consider  Abnormal PFT-discussed findings, likely some chronic damage due to smoking.  Discussed need to quit smoking    Zymere was seen today for cpe.  Diagnoses and all orders for this visit:  Routine general medical examination at a health care facility -     POCT Urinalysis DIP (Proadvantage Device) -     Microalbumin / creatinine urine ratio -     Hemoglobin A1c -     Comprehensive metabolic panel -     CBC -     Lipid panel -     PSA  Non-recurrent unilateral inguinal hernia without obstruction or gangrene  Allergic rhinitis due to pollen, unspecified seasonality  BPH without urinary obstruction -     PSA  Creatinine elevation  Smoking -     Spirometry with Graph  Vaccine counseling  Decreased lung sounds  Impaired fasting blood sugar -     Hemoglobin A1c  Mild intermittent asthma, unspecified whether complicated  Abnormal PFT   Follow-up pending labs, yearly for physical

## 2018-04-23 NOTE — Addendum Note (Signed)
Addended by: Gwinda Maine on: 04/23/2018 02:48 PM   Modules accepted: Orders

## 2018-04-24 LAB — CBC
HEMATOCRIT: 47.6 % (ref 37.5–51.0)
HEMOGLOBIN: 15.6 g/dL (ref 13.0–17.7)
MCH: 29.5 pg (ref 26.6–33.0)
MCHC: 32.8 g/dL (ref 31.5–35.7)
MCV: 90 fL (ref 79–97)
Platelets: 212 10*3/uL (ref 150–450)
RBC: 5.29 x10E6/uL (ref 4.14–5.80)
RDW: 13 % (ref 12.3–15.4)
WBC: 6.1 10*3/uL (ref 3.4–10.8)

## 2018-04-24 LAB — COMPREHENSIVE METABOLIC PANEL
ALBUMIN: 4.8 g/dL (ref 3.5–5.5)
ALT: 18 IU/L (ref 0–44)
AST: 23 IU/L (ref 0–40)
Albumin/Globulin Ratio: 1.8 (ref 1.2–2.2)
Alkaline Phosphatase: 59 IU/L (ref 39–117)
BUN / CREAT RATIO: 12 (ref 9–20)
BUN: 16 mg/dL (ref 6–24)
Bilirubin Total: 0.4 mg/dL (ref 0.0–1.2)
CHLORIDE: 101 mmol/L (ref 96–106)
CO2: 21 mmol/L (ref 20–29)
Calcium: 10.1 mg/dL (ref 8.7–10.2)
Creatinine, Ser: 1.37 mg/dL — ABNORMAL HIGH (ref 0.76–1.27)
GFR, EST AFRICAN AMERICAN: 68 mL/min/{1.73_m2} (ref >59)
GFR, EST NON AFRICAN AMERICAN: 58 mL/min/{1.73_m2} — AB (ref >59)
GLUCOSE: 87 mg/dL (ref 65–99)
Globulin, Total: 2.7 g/dL (ref 1.5–4.5)
POTASSIUM: 4.5 mmol/L (ref 3.5–5.2)
Sodium: 143 mmol/L (ref 134–144)
TOTAL PROTEIN: 7.5 g/dL (ref 6.0–8.5)

## 2018-04-24 LAB — URINALYSIS, MICROSCOPIC ONLY
Casts: NONE SEEN /lpf
EPITHELIAL CELLS (NON RENAL): NONE SEEN /HPF (ref 0–10)

## 2018-04-24 LAB — LIPID PANEL
CHOL/HDL RATIO: 3.3 ratio (ref 0.0–5.0)
CHOLESTEROL TOTAL: 157 mg/dL (ref 100–199)
HDL: 47 mg/dL (ref >39)
LDL CALC: 82 mg/dL (ref 0–99)
Triglycerides: 138 mg/dL (ref 0–149)
VLDL Cholesterol Cal: 28 mg/dL (ref 5–40)

## 2018-04-24 LAB — MICROALBUMIN / CREATININE URINE RATIO: CREATININE, UR: 55.8 mg/dL

## 2018-04-24 LAB — PSA: Prostate Specific Ag, Serum: 3.7 ng/mL (ref 0.0–4.0)

## 2018-04-24 LAB — HEMOGLOBIN A1C
Est. average glucose Bld gHb Est-mCnc: 131 mg/dL
Hgb A1c MFr Bld: 6.2 % — ABNORMAL HIGH (ref 4.8–5.6)

## 2018-04-25 ENCOUNTER — Other Ambulatory Visit: Payer: Self-pay | Admitting: Internal Medicine

## 2018-04-25 DIAGNOSIS — R972 Elevated prostate specific antigen [PSA]: Secondary | ICD-10-CM

## 2018-05-12 ENCOUNTER — Telehealth: Payer: Self-pay | Admitting: Medical

## 2018-05-12 NOTE — Telephone Encounter (Signed)
Can try Milk of Magnesia OTC or Miralax 1 cap daily or BID a few days prn.

## 2018-05-12 NOTE — Telephone Encounter (Signed)
Pt called and stated that he has had constipation today and yesterday. He is requesting a recommendation for something OTC. Please advise pt at [phone].

## 2018-05-13 NOTE — Telephone Encounter (Signed)
Patient notified of recommendations. 

## 2018-05-14 ENCOUNTER — Ambulatory Visit: Payer: BC Managed Care – PPO | Admitting: Medical

## 2018-05-14 ENCOUNTER — Encounter: Payer: Self-pay | Admitting: Medical

## 2018-05-14 VITALS — BP 120/90 | HR 78 | Temp 98.4°F | Resp 16 | Ht 73.0 in | Wt 224.2 lb

## 2018-05-14 DIAGNOSIS — R972 Elevated prostate specific antigen [PSA]: Secondary | ICD-10-CM | POA: Diagnosis not present

## 2018-05-14 DIAGNOSIS — R109 Unspecified abdominal pain: Secondary | ICD-10-CM | POA: Diagnosis not present

## 2018-05-14 DIAGNOSIS — K5909 Other constipation: Secondary | ICD-10-CM | POA: Diagnosis not present

## 2018-05-14 DIAGNOSIS — R110 Nausea: Secondary | ICD-10-CM | POA: Diagnosis not present

## 2018-05-14 LAB — POCT URINALYSIS DIP (PROADVANTAGE DEVICE)
BILIRUBIN UA: NEGATIVE
Glucose, UA: NEGATIVE mg/dL
Ketones, POC UA: NEGATIVE mg/dL
LEUKOCYTES UA: NEGATIVE
NITRITE UA: NEGATIVE
PH UA: 6 (ref 5.0–8.0)
Specific Gravity, Urine: 1.025
Urobilinogen, Ur: NEGATIVE

## 2018-05-14 MED ORDER — HYDROCODONE-ACETAMINOPHEN 5-325 MG PO TABS: 1.0000 | ORAL_TABLET | Freq: Four times a day (QID) | ORAL | 0 refills | Status: DC | PRN

## 2018-05-14 MED ORDER — SULFAMETHOXAZOLE-TRIMETHOPRIM 800-160 MG PO TABS: 1.0000 | ORAL_TABLET | Freq: Two times a day (BID) | ORAL | 0 refills | Status: DC

## 2018-05-14 NOTE — Progress Notes (Signed)
Subjective: Chief Complaint  Patient presents with  . stomach pain    stomach pain right,  nausea, not eating, X Sunday    Here for right lower quadrant pain, stays constant. Has had some nausea, no vomiting.  Has decreased appetite.   Has hx/o chronic constipation, been a little constipated over weekend.  Using some miralax with help.  Last BM about 2pm today, normal.  No fever.   No back pain.  No urinary c/o, no urgency, no blood in urine, no dysuria.  Sunday night 4 nights ago couldn't walk due to the pain.  Last year had a similar pain RLQ.    No other aggravating or relieving factors. No other complaint.  Past Medical History:  Diagnosis Date  . Asthma    childhood  . BPH (benign prostatic hypertrophy)    per exam and PSA, asymptomatic  . Cigar smoker   . Environmental allergies   . Seasonal allergic rhinitis   . Wears glasses    Current Outpatient Medications on File Prior to Visit  Medication Sig Dispense Refill  . DiphenhydrAMINE HCl ([facility] PO) Take by mouth.    . Multiple Vitamin (MULTIVITAMIN) capsule Take 1 capsule by mouth daily.     Current Facility-Administered Medications on File Prior to Visit  Medication Dose Route Frequency Provider Last Rate Last Dose  . 0.9 %  sodium chloride infusion  500 mL Intravenous Continuous Armbruster, Carlota Raspberry, MD       Past Surgical History:  Procedure Laterality Date  . APPENDECTOMY  1983  . COLONOSCOPY  06/2016   tubular adenoma, repeat 3 years, Dr. Havery Moros  . WISDOM TOOTH EXTRACTION      ROS as in subjective   Objective: BP 120/90   Pulse 78   Temp 98.4 F (36.9 C) (Oral)   Resp 16   Ht 6\' 1"  (1.854 m)   Wt 224 lb 3.2 oz (101.7 kg)   SpO2 97%   BMI 29.58 kg/m   General appearance: alert, no distress, WD/WN,  Abdomen: +bs, soft, right mid abdomen tenderness, RLQ surgical scar, otherwise non tender, non distended, no masses, no hepatomegaly, no splenomegaly Pulses: 2+ symmetric, upper and lower  extremities, normal cap refill Ext: no edema Back non tender GU: Normal male GU circumcised, left mild inguinal hernia, no right-sided hernia, no lymphadenopathy or mass      Assessment: Encounter Diagnoses  Name Primary?  . Stomach pain Yes  . Right sided abdominal pain   . Elevated PSA   . Nausea   . Chronic constipation      Plan: We discussed possible causes of his right lower quadrant pain including urinary tract infection, prostate infection, liver or kidney mass, or other cause.  We will presumptively treat for prostatitis.  He already has follow-up scheduled next week for first appointment with urology for elevated PSA.  Consider abdominal imaging if not improving with the treatment below.  Reviewed last [place] [facility] note.  Kahleb was seen today for stomach pain.  Diagnoses and all orders for this visit:  Stomach pain -     POCT Urinalysis DIP (Proadvantage Device) -     Urine Culture  Right sided abdominal pain -     Urine Culture  Elevated PSA  Nausea  Chronic constipation  Other orders -     sulfamethoxazole-trimethoprim (BACTRIM DS,SEPTRA DS) 800-160 MG tablet; Take 1 tablet by mouth 2 (two) times daily. -     HYDROcodone-acetaminophen (NORCO) 5-325 MG tablet; Take  1 tablet by mouth every 6 (six) hours as needed for moderate pain.

## 2018-05-15 LAB — URINE CULTURE: Organism ID, Bacteria: NO GROWTH

## 2018-05-30 ENCOUNTER — Other Ambulatory Visit: Payer: Self-pay | Admitting: Medical

## 2018-05-30 NOTE — Telephone Encounter (Signed)
If improved, then we don't need to refill this

## 2018-05-30 NOTE — Telephone Encounter (Signed)
[facility]. Called pt he has two more ills left. Please advise if this is ok to fill . Per pt he does feel better taking it and is not using the pain med. [place]

## 2018-06-20 ENCOUNTER — Other Ambulatory Visit: Payer: Self-pay | Admitting: Urology

## 2018-06-23 ENCOUNTER — Other Ambulatory Visit: Payer: Self-pay | Admitting: Urology

## 2018-06-23 NOTE — Progress Notes (Signed)
Pt needs informed consent order added to litho orders in epic for scheduled procedure on 07/03/2018. Thanks

## 2018-07-03 ENCOUNTER — Ambulatory Visit (HOSPITAL_COMMUNITY): Payer: BC Managed Care – PPO

## 2018-07-03 ENCOUNTER — Encounter (HOSPITAL_COMMUNITY)
Admission: RE | Disposition: A | Discharge status: Home / Self Care | Payer: Self-pay | Source: Home / Self Care | Attending: Urology

## 2018-07-03 ENCOUNTER — Ambulatory Visit (HOSPITAL_COMMUNITY)
Admission: RE | Admit: 2018-07-03 | Discharge: 2018-07-03 | Disposition: A | Discharge status: Home / Self Care | Payer: BC Managed Care – PPO | Attending: Urology | Admitting: Urology

## 2018-07-03 ENCOUNTER — Encounter (HOSPITAL_COMMUNITY): Payer: Self-pay | Admitting: General Practice

## 2018-07-03 DIAGNOSIS — Z8709 Personal history of other diseases of the respiratory system: Secondary | ICD-10-CM | POA: Insufficient documentation

## 2018-07-03 DIAGNOSIS — N201 Calculus of ureter: Secondary | ICD-10-CM | POA: Diagnosis present

## 2018-07-03 DIAGNOSIS — F17290 Nicotine dependence, other tobacco product, uncomplicated: Secondary | ICD-10-CM | POA: Diagnosis not present

## 2018-07-03 DIAGNOSIS — N400 Enlarged prostate without lower urinary tract symptoms: Secondary | ICD-10-CM | POA: Insufficient documentation

## 2018-07-03 DIAGNOSIS — Z8249 Family history of ischemic heart disease and other diseases of the circulatory system: Secondary | ICD-10-CM | POA: Insufficient documentation

## 2018-07-03 HISTORY — PX: EXTRACORPOREAL SHOCK WAVE LITHOTRIPSY: SHX1557

## 2018-07-03 HISTORY — DX: Personal history of urinary calculi: Z87.442

## 2018-07-03 SURGERY — LITHOTRIPSY, ESWL
Anesthesia: LOCAL | Laterality: Right

## 2018-07-03 MED ORDER — DIPHENHYDRAMINE HCL 25 MG PO CAPS
25.0000 mg | ORAL_CAPSULE | ORAL | Status: AC
  Administered 2018-07-03: 25 mg via ORAL
  Filled 2018-07-03: qty 1

## 2018-07-03 MED ORDER — SODIUM CHLORIDE 0.9 % IV SOLN
INTRAVENOUS | Status: DC
  Administered 2018-07-03: 07:00:00 via INTRAVENOUS

## 2018-07-03 MED ORDER — CIPROFLOXACIN HCL 500 MG PO TABS
500.0000 mg | ORAL_TABLET | ORAL | Status: AC
  Administered 2018-07-03: 500 mg via ORAL
  Filled 2018-07-03: qty 1

## 2018-07-03 MED ORDER — DIAZEPAM 5 MG PO TABS
10.0000 mg | ORAL_TABLET | ORAL | Status: AC
  Administered 2018-07-03: 10 mg via ORAL
  Filled 2018-07-03: qty 2

## 2018-07-03 NOTE — Brief Op Note (Signed)
07/03/2018  8:42 AM  PATIENT:  Alexander Dean  52 y.o. male  PRE-OPERATIVE DIAGNOSIS:  RIGHT DISTAL URETERAL STONE  POST-OPERATIVE DIAGNOSIS:  * No post-op diagnosis entered *  PROCEDURE:  Procedure(s): EXTRACORPOREAL SHOCK WAVE LITHOTRIPSY (ESWL) (Right)  SURGEON:  Surgeon(s) and Role:    * Alexis Frock, MD - Primary  PHYSICIAN ASSISTANT:   ASSISTANTS: none   ANESTHESIA:   MAC  EBL:  minimal   BLOOD ADMINISTERED:none  DRAINS: none   LOCAL MEDICATIONS USED:  NONE  SPECIMEN:  No Specimen  DISPOSITION OF SPECIMEN:  N/A  COUNTS:  YES  TOURNIQUET:  * No tourniquets in log *  DICTATION: .Note written in paper chart  PLAN OF CARE: Discharge to home after PACU  PATIENT DISPOSITION:  PACU - hemodynamically stable.   Delay start of Pharmacological VTE agent (>24hrs) due to surgical blood loss or risk of bleeding: yes

## 2018-07-03 NOTE — H&P (Signed)
Alexander Dean is an 52 y.o. male.    Chief Complaint: Pre-op RIGHT Shockwave Lithotripsy  HPI:   1- RIGHT Distal Ureteral Stones - about 76mm conglomerate of right distal stones by CT, KUB 2019 on eval hematuria. NO hydro. UA without infectious parameters. Cr <1.5. Stones are 1100HU, SSD 11cm.    TOday "Alexander Dean" is seen to proceed with shockwave lithotripsy for right distal stones. No interval fevers.   Past Medical History:  Diagnosis Date  . Asthma    childhood  . BPH (benign prostatic hypertrophy)    per exam and PSA, asymptomatic  . Cigar smoker   . Environmental allergies   . Seasonal allergic rhinitis   . Wears glasses     Past Surgical History:  Procedure Laterality Date  . APPENDECTOMY  1983  . COLONOSCOPY  06/2016   tubular adenoma, repeat 3 years, Dr. Havery Moros  . WISDOM TOOTH EXTRACTION      Family History  Problem Relation Age of Onset  . Other Mother        renal stones  . Diabetes Mother   . Hypertension Mother   . Arthritis Father        knees  . Other Father        renal stones  . Cancer Neg Hx   . Hyperlipidemia Neg Hx   . Colon cancer Neg Hx    Social History:  reports that he has been smoking cigars. He has never used smokeless tobacco. He reports current alcohol use. He reports that he does not use drugs.  Allergies:  Allergies  Allergen Reactions  . Other Anaphylaxis    peanuts  . Shellfish Allergy Anaphylaxis    No medications prior to admission.    No results found for this or any previous visit (from the past 48 hour(s)). No results found.  Review of Systems  Constitutional: Negative.  Negative for chills and fever.  HENT: Negative.   Eyes: Negative.   Respiratory: Negative.   Gastrointestinal: Negative.   Genitourinary: Positive for hematuria.  Musculoskeletal: Negative.   Skin: Negative.   Neurological: Negative.   Endo/Heme/Allergies: Negative.   Psychiatric/Behavioral: Negative.     There were no vitals taken for  this visit. Physical Exam  Constitutional: He appears well-developed.  HENT:  Head: Normocephalic.  Eyes: Pupils are equal, round, and reactive to light.  Cardiovascular: Normal rate.  Respiratory: Effort normal.  GI: Soft.  Genitourinary:    Genitourinary Comments: No CVAT at present.   Neurological: He is alert.  Skin: Skin is warm.  Psychiatric: He has a normal mood and affect.     Assessment/Plan  Proceed as planned with RIGHT SWL. RIsks, benefits, alternatives, expected peri-treatment course discussed previously and reiterated today.   Alexis Frock, MD 07/03/2018, 5:17 AM

## 2018-07-03 NOTE — Discharge Instructions (Signed)
° °  1 - You may pass small stone fragments and have bloody urine on / off for up to 2 weeks. This is normal.  2 - Call MD or go to ER for fever >102, severe pain / nausea / vomiting not relieved by medications, or acute change in medical status

## 2018-07-07 ENCOUNTER — Encounter (HOSPITAL_COMMUNITY): Payer: Self-pay | Admitting: Urology

## 2019-04-27 ENCOUNTER — Encounter: Payer: Self-pay | Admitting: Medical

## 2019-04-27 ENCOUNTER — Ambulatory Visit (INDEPENDENT_AMBULATORY_CARE_PROVIDER_SITE_OTHER): Payer: BC Managed Care – PPO | Admitting: Medical

## 2019-04-27 ENCOUNTER — Other Ambulatory Visit: Payer: Self-pay

## 2019-04-27 VITALS — BP 122/68 | HR 60 | Temp 97.5°F | Ht 73.0 in | Wt 241.4 lb

## 2019-04-27 DIAGNOSIS — R7989 Other specified abnormal findings of blood chemistry: Secondary | ICD-10-CM | POA: Diagnosis not present

## 2019-04-27 DIAGNOSIS — Z0000 Encounter for general adult medical examination without abnormal findings: Secondary | ICD-10-CM

## 2019-04-27 DIAGNOSIS — J4520 Mild intermittent asthma, uncomplicated: Secondary | ICD-10-CM

## 2019-04-27 DIAGNOSIS — F17200 Nicotine dependence, unspecified, uncomplicated: Secondary | ICD-10-CM | POA: Diagnosis not present

## 2019-04-27 DIAGNOSIS — R972 Elevated prostate specific antigen [PSA]: Secondary | ICD-10-CM

## 2019-04-27 DIAGNOSIS — Z2821 Immunization not carried out because of patient refusal: Secondary | ICD-10-CM

## 2019-04-27 DIAGNOSIS — Z7189 Other specified counseling: Secondary | ICD-10-CM

## 2019-04-27 DIAGNOSIS — K4090 Unilateral inguinal hernia, without obstruction or gangrene, not specified as recurrent: Secondary | ICD-10-CM

## 2019-04-27 DIAGNOSIS — Z7185 Encounter for immunization safety counseling: Secondary | ICD-10-CM

## 2019-04-27 DIAGNOSIS — N400 Enlarged prostate without lower urinary tract symptoms: Secondary | ICD-10-CM

## 2019-04-27 DIAGNOSIS — R7301 Impaired fasting glucose: Secondary | ICD-10-CM

## 2019-04-27 NOTE — Progress Notes (Signed)
Subjective:   HPI  Alexander Dean is a 53 y.o. male who presents for Chief Complaint  Patient presents with  . Annual Exam    Patient Care Team: Tysinger, Camelia Eng, PA-C as PCP - General (Family Medicine) Sees dentist Sees eye doctor Dr. Havery Moros, GI  Concerns: None, feeling fine  Reviewed their medical, surgical, family, social, medication, and allergy history and updated chart as appropriate.  Past Medical History:  Diagnosis Date  . Asthma    childhood  . BPH (benign prostatic hypertrophy)    per exam and PSA, asymptomatic  . Cigar smoker   . Environmental allergies   . History of kidney stones   . Seasonal allergic rhinitis   . Wears glasses     Past Surgical History:  Procedure Laterality Date  . APPENDECTOMY  1983  . COLONOSCOPY  06/2016   tubular adenoma, repeat 3 years, Dr. Havery Moros  . EXTRACORPOREAL SHOCK WAVE LITHOTRIPSY Right 07/03/2018   Procedure: EXTRACORPOREAL SHOCK WAVE LITHOTRIPSY (ESWL);  Surgeon: Alexis Frock, MD;  Location: [facility];  Service: Urology;  Laterality: Right;  . WISDOM TOOTH EXTRACTION      Social History   Socioeconomic History  . Marital status: Married    Spouse name: Not on file  . Number of children: Not on file  . Years of education: Not on file  . Highest education level: Not on file  Occupational History  . Not on file  Social Needs  . Financial resource strain: Not on file  . Food insecurity    Worry: Not on file    Inability: Not on file  . Transportation needs    Medical: Not on file    Non-medical: Not on file  Tobacco Use  . Smoking status: Current Some Day Smoker    Types: Cigars  . Smokeless tobacco: Never Used  . Tobacco comment: one cigar weekly  Substance and Sexual Activity  . Alcohol use: Yes    Alcohol/week: 0.0 standard drinks    Comment: once monthly  . Drug use: No  . Sexual activity: Not on file  Lifestyle  . Physical activity    Days per week: Not on file    Minutes per session:  Not on file  . Stress: Not on file  Relationships  . Social Herbalist on phone: Not on file    Gets together: Not on file    Attends religious service: Not on file    Active member of club or organization: Not on file    Attends meetings of clubs or organizations: Not on file    Relationship status: Not on file  . Intimate partner violence    Fear of current or ex partner: Not on file    Emotionally abused: Not on file    Physically abused: Not on file    Forced sexual activity: Not on file  Other Topics Concern  . 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   04/2019    Family History  Problem Relation Age of Onset  . Other Mother        renal stones  . Diabetes Mother   . Hypertension Mother   . Arthritis Father        knees  . Other Father  renal stones  . Cancer Neg Hx   . Hyperlipidemia Neg Hx   . Colon cancer Neg Hx   . Heart disease Neg Hx   . Stroke Neg Hx      Current Outpatient Medications:  .  DiphenhydrAMINE HCl ([facility] PO), Take by mouth., Disp: , Rfl:  .  HYDROcodone-acetaminophen (NORCO) 5-325 MG tablet, Take 1 tablet by mouth every 6 (six) hours as needed for moderate pain. (Patient not taking: Reported on 04/27/2019), Disp: 10 tablet, Rfl: 0 .  Multiple Vitamin (MULTIVITAMIN) capsule, Take 1 capsule by mouth daily., Disp: , Rfl:  .  sulfamethoxazole-trimethoprim (BACTRIM DS,SEPTRA DS) 800-160 MG tablet, TAKE 1 TABLET BY MOUTH TWICE A DAY (Patient not taking: Reported on 04/27/2019), Disp: 28 tablet, Rfl: 0  Current Facility-Administered Medications:  .  0.9 %  sodium chloride infusion, 500 mL, Intravenous, Continuous, Armbruster, Carlota Raspberry, MD  Allergies  Allergen Reactions  . Other Anaphylaxis    peanuts  . Shellfish Allergy Anaphylaxis        Review of Systems Constitutional: -fever, -chills, -sweats, -unexpected weight change, -decreased appetite, -fatigue Allergy: -sneezing, -itching, -congestion Dermatology: -changing moles, --rash, -lumps ENT: -runny nose, -ear pain, -sore throat, -hoarseness, -sinus pain, -teeth pain, - ringing in ears, -hearing loss, -nosebleeds Cardiology: -chest pain, -palpitations, -swelling, -difficulty breathing when lying flat, -waking up short of breath Respiratory: -cough, -shortness of breath, -difficulty breathing with exercise or exertion, -wheezing, -coughing up blood Gastroenterology: -abdominal pain, -nausea, -vomiting, -diarrhea, -constipation, -blood in stool, -changes in bowel movement, -difficulty swallowing or eating Hematology: -bleeding, -bruising  Musculoskeletal: -joint aches, -muscle aches, -joint swelling, -back pain, -neck pain, -cramping, -changes in gait Ophthalmology: denies vision changes, eye redness, itching, discharge Urology: -burning with urination, -difficulty urinating, -blood in urine, -urinary frequency, -urgency, -incontinence Neurology: -headache, -weakness, -tingling, -numbness, -memory loss, -falls, -dizziness Psychology: -depressed mood, -agitation, -sleep problems Male GU: no testicular mass, pain, no lymph nodes swollen, no swelling, no rash.     Objective:  BP 122/68   Pulse 60   Temp (!) 97.5 F (36.4 C)   Ht 6\' 1"  (1.854 m)   Wt 241 lb 6.4 oz (109.5 kg)   SpO2 98%   BMI 31.85 kg/m   General appearance: alert, no distress, WD/WN, African American male Skin: rough skin patches upper mid back suggestive of eczema.  No worrisome lesions otherwise  HEENT: normocephalic, conjunctiva/corneas normal, sclerae anicteric, PERRLA, EOMi, nares patent, no discharge or erythema, pharynx normal Oral cavity: MMM, tongue normal, teeth normal Neck: supple, no lymphadenopathy, no thyromegaly, no masses, normal ROM, no bruits Chest: non tender, normal shape  and expansion Heart: RRR, normal S1, S2, no murmurs Lungs: CTA bilaterally, no wheezes, rhonchi, or rales Abdomen: +bs, soft, RLQ surgical scar, non tender, non distended, no masses, no hepatomegaly, no splenomegaly, no bruits Back: non tender, normal ROM, no scoliosis Musculoskeletal: upper extremities non tender, no obvious deformity, normal ROM throughout, lower extremities non tender, no obvious deformity, normal ROM throughout Extremities: no edema, no cyanosis, no clubbing Pulses: 2+ symmetric, upper and lower extremities, normal cap refill Neurological: alert, oriented x 3, CN2-12 intact, strength normal upper extremities and lower extremities, sensation normal throughout, DTRs 2+ throughout, no cerebellar signs, gait normal Psychiatric: normal affect, behavior normal, pleasant  GU: normal male external genitalia,circumcised, nontender, no masses, left reducible moderate inguina hernia, no other hernia, no lymphadenopathy Rectal: deferred to urology   Adult ECG Report Reviewed, no acute changes from 2017 EKG, sinus bradycardia  Assessment and Plan :   Encounter Diagnoses  Name Primary?  . Routine general medical examination at a health care facility Yes  . Smoking   . Elevated PSA   . Creatinine elevation   . BPH without urinary obstruction   . Impaired fasting blood sugar   . Mild intermittent asthma, unspecified whether complicated   . Left inguinal hernia   . Vaccine counseling   . Influenza vaccination declined     Physical exam - discussed and counseled on healthy lifestyle, diet, exercise, preventative care, vaccinations, sick and well care, proper use of emergency dept and after hours care, and addressed their concerns.    Health screening: See your eye doctor yearly for routine vision care. See your dentist yearly for routine dental care including hygiene visits twice yearly.  Cancer screening Colonoscopy:  Reviewed colonoscopy on file that is up to  date Due for repeat as of 06/2019.  Discussed PSA, prostate exam, and prostate cancer screening risks/benefits.   He has urology f/u in 08/2019  Vaccinations: Advised yearly influenza vaccine Patient declines influenza vaccine  Shingles vaccine:  I recommend you have a shingles vaccine to help prevent shingles or herpes zoster outbreak.   Please call your insurer to inquire about coverage for the Shingrix vaccine given in 2 doses.   Some insurers cover this vaccine after age 58, some cover this after age 56.  If your insurer covers this, then call to schedule appointment to have this vaccine here.   Acute issues discussed: none  Separate significant chronic issues discussed: BPH, elevated PSA - follow up with [facility] 08/2018  Impaired fasting glucose - Hgb A1C labs today  Advised no smoking on cigars  Left inguinal hernia - he will let us know if he wants to pursue this now or wait until after first of the year into 2021.  I reviewed 08/2018 urology notes and 2016 nephrology notes regarding elevated Creatinine due to muscle mass.  Olujimi was seen today for annual exam.  Diagnoses and all orders for this visit:  Routine general medical examination at a health care facility -     Microalbumin/Creatinine Ratio, Urine -     Hemoglobin A1c -     Lipid panel -     CBC -     EKG 12-Lead -     Comprehensive metabolic panel  Smoking -     EKG 12-Lead  Elevated PSA  Creatinine elevation -     Microalbumin/Creatinine Ratio, Urine  BPH without urinary obstruction  Impaired fasting blood sugar -     Hemoglobin A1c  Mild intermittent asthma, unspecified whether complicated  Left inguinal hernia  Vaccine counseling  Influenza vaccination declined    Follow-up pending labs, yearly for physical

## 2019-04-27 NOTE — Patient Instructions (Signed)
Thanks for trusting [place] with your health care and for coming in for a physical today.  Below are some general recommendations I have for you:  Yearly screenings See your eye doctor yearly for routine vision care. See your dentist yearly for routine dental care including hygiene visits twice yearly. See me here yearly for a routine physical and preventative care visit   Specific Concerns today:  . Shingles vaccine:  I recommend you have a shingles vaccine to help prevent shingles or herpes zoster outbreak.   Please call your insurer to inquire about coverage for the Shingrix vaccine given in 2 doses.   Some insurers cover this vaccine after age 20, some cover this after age 42.  If your insurer covers this, then call to schedule appointment to have this vaccine here. . Consider flu shot . Let me know when you want referral for inguinal hernia repair . You are due for repeat colonoscopy as of 06/2019   Please follow up yearly for a physical.   Preventative Care for Adults - Male      [place]:  A routine yearly physical is a good way to check in with your primary care provider about your health and preventive screening. It is also an opportunity to share updates about your health and any concerns you have, and receive a thorough all-over exam.   Most health insurance companies pay for at least some preventative services.  Check with your health plan for specific coverages.  WHAT PREVENTATIVE SERVICES DO MEN NEED?  Adult men should have their weight and blood pressure checked regularly.   Men age 44 and older should have their cholesterol levels checked regularly.  Beginning at age 14 and continuing to age 48, men should be screened for colorectal cancer.  Certain people may need continued testing until age 90.  Updating vaccinations is part of preventative care.  Vaccinations help protect against diseases such as the flu.  Osteoporosis is a disease in which the  bones lose minerals and strength as we age. Men ages 53 and over should discuss this with their caregivers  Lab tests are generally done as part of preventative care to screen for anemia and blood disorders, to screen for problems with the kidneys and liver, to screen for bladder problems, to check blood sugar, and to check your cholesterol level.  Preventative services generally include counseling about diet, exercise, avoiding tobacco, drugs, excessive alcohol consumption, and sexually transmitted infections.    GENERAL RECOMMENDATIONS FOR GOOD HEALTH:  Healthy diet:  Eat a variety of foods, including fruit, vegetables, animal or vegetable protein, such as meat, fish, chicken, and eggs, or beans, lentils, tofu, and grains, such as rice.  Drink plenty of water daily.  Decrease saturated fat in the diet, avoid lots of red meat, processed foods, sweets, fast foods, and fried foods.  Exercise:  Aerobic exercise helps maintain good heart health. At least 30-40 minutes of moderate-intensity exercise is recommended. For example, a brisk walk that increases your heart rate and breathing. This should be done on most days of the week.   Find a type of exercise or a variety of exercises that you enjoy so that it becomes a part of your daily life.  Examples are running, walking, swimming, water aerobics, and biking.  For motivation and support, explore group exercise such as aerobic class, spin class, Zumba, Yoga,or  martial arts, etc.    Set exercise goals for yourself, such as a certain weight goal,  walk or run in a race such as a 5k walk/run.  Speak to your primary care provider about exercise goals.  Disease prevention:  If you smoke or chew tobacco, find out from your caregiver how to quit. It can literally save your life, no matter how long you have been a tobacco user. If you do not use tobacco, never begin.   Maintain a healthy diet and normal weight. Increased weight leads to problems with  blood pressure and diabetes.   The Body Mass Index or BMI is a way of measuring how much of your body is fat. Having a BMI above 27 increases the risk of heart disease, diabetes, hypertension, stroke and other problems related to obesity. Your caregiver can help determine your BMI and based on it develop an exercise and dietary program to help you achieve or maintain this important measurement at a healthful level.  High blood pressure causes heart and blood vessel problems.  Persistent high blood pressure should be treated with medicine if weight loss and exercise do not work.   Fat and cholesterol leaves deposits in your arteries that can block them. This causes heart disease and vessel disease elsewhere in your body.  If your cholesterol is found to be high, or if you have heart disease or certain other medical conditions, then you may need to have your cholesterol monitored frequently and be treated with medication.   Ask if you should have a cardiac stress test if your history suggests this. A stress test is a test done on a treadmill that looks for heart disease. This test can find disease prior to there being a problem.  Osteoporosis is a disease in which the bones lose minerals and strength as we age. This can result in serious bone fractures. Risk of osteoporosis can be identified using a bone density scan. Men ages 51 and over should discuss this with their caregivers. Ask your caregiver whether you should be taking a calcium supplement and Vitamin D, to reduce the rate of osteoporosis.   Avoid drinking alcohol in excess (more than two drinks per day).  Avoid use of street drugs. Do not share needles with anyone. Ask for professional help if you need assistance or instructions on stopping the use of alcohol, cigarettes, and/or drugs.  Brush your teeth twice a day with fluoride toothpaste, and floss once a day. Good oral hygiene prevents tooth decay and gum disease. The problems can be  painful, unattractive, and can cause other health problems. Visit your dentist for a routine oral and dental check up and preventive care every 6-12 months.   Look at your skin regularly.  Use a mirror to look at your back. Notify your caregivers of changes in moles, especially if there are changes in shapes, colors, a size larger than a pencil eraser, an irregular border, or development of new moles.  Safety:  Use seatbelts 100% of the time, whether driving or as a passenger.  Use safety devices such as hearing protection if you work in environments with loud noise or significant background noise.  Use safety glasses when doing any work that could send debris in to the eyes.  Use a helmet if you ride a bike or motorcycle.  Use appropriate safety gear for contact sports.  Talk to your caregiver about gun safety.  Use sunscreen with a SPF (or skin protection factor) of 15 or greater.  Lighter skinned people are at a greater risk of skin cancer. Don't forget to also  wear sunglasses in order to protect your eyes from too much damaging sunlight. Damaging sunlight can accelerate cataract formation.   Practice safe sex. Use condoms. Condoms are used for birth control and to help reduce the spread of sexually transmitted infections (or STIs).  Some of the STIs are gonorrhea (the clap), chlamydia, syphilis, trichomonas, herpes, HPV (human papilloma virus) and HIV (human immunodeficiency virus) which causes AIDS. The herpes, HIV and HPV are viral illnesses that have no cure. These can result in disability, cancer and death.   Keep carbon monoxide and smoke detectors in your home functioning at all times. Change the batteries every 6 months or use a model that plugs into the wall.   Vaccinations:  Stay up to date with your tetanus shots and other required immunizations. You should have a booster for tetanus every 10 years. Be sure to get your flu shot every year, since 5%-20% of the [place] population comes down  with the flu. The flu vaccine changes each year, so being vaccinated once is not enough. Get your shot in the fall, before the flu season peaks.   Other vaccines to consider:  Human Papilloma Virus or HPV causes cancer of the cervix, and other infections that can be transmitted from person to person. There is a vaccine for HPV, and males should get immunized between the ages of 15 and 69. It requires a series of 3 shots.   Pneumococcal vaccine to protect against certain types of pneumonia.  This is normally recommended for adults age 78 or older.  However, adults younger than 54 years old with certain underlying conditions such as diabetes, heart or lung disease should also receive the vaccine.  Shingles vaccine to protect against Varicella Zoster if you are older than age 68, or younger than 54 years old with certain underlying illness.  If you have not had the Shingrix vaccine, please call your insurer to inquire about coverage for the Shingrix vaccine given in 2 doses.   Some insurers cover this vaccine after age 36, some cover this after age 49.  If your insurer covers this, then call to schedule appointment to have this vaccine here  Hepatitis A vaccine to protect against a form of infection of the liver by a virus acquired from food.  Hepatitis B vaccine to protect against a form of infection of the liver by a virus acquired from blood or body fluids, particularly if you work in health care.  If you plan to travel internationally, check with your local health department for specific vaccination recommendations.   What should I know about cancer screening? Many types of cancers can be detected early and may often be prevented. Lung Cancer  You should be screened every year for lung cancer if: ? You are a current smoker who has smoked for at least 30 years. ? You are a former smoker who has quit within the past 15 years.  Talk to your health care provider about your screening options,  when you should start screening, and how often you should be screened.  Colorectal Cancer  Routine colorectal cancer screening usually begins at 54 years of age and should be repeated every 5-10 years until you are 54 years old. You may need to be screened more often if early forms of precancerous polyps or small growths are found. Your health care provider may recommend screening at an earlier age if you have risk factors for colon cancer.  Your health care provider may recommend using home  test kits to check for hidden blood in the stool.  A small camera at the end of a tube can be used to examine your colon (sigmoidoscopy or colonoscopy). This checks for the earliest forms of colorectal cancer.  Prostate and Testicular Cancer  Depending on your age and overall health, your health care provider may do certain tests to screen for prostate and testicular cancer.  Talk to your health care provider about any symptoms or concerns you have about testicular or prostate cancer.  Skin Cancer  Check your skin from head to toe regularly.  Tell your health care provider about any new moles or changes in moles, especially if: ? There is a change in a mole's size, shape, or color. ? You have a mole that is larger than a pencil eraser.  Always use sunscreen. Apply sunscreen liberally and repeat throughout the day.  Protect yourself by wearing long sleeves, pants, a wide-brimmed hat, and sunglasses when outside.

## 2019-04-28 LAB — MICROALBUMIN / CREATININE URINE RATIO
Creatinine, Urine: 177.5 mg/dL
Microalb/Creat Ratio: 3 mg/g creat (ref 0–29)
Microalbumin, Urine: 5.4 ug/mL

## 2019-04-28 LAB — COMPREHENSIVE METABOLIC PANEL
ALT: 24 IU/L (ref 0–44)
AST: 29 IU/L (ref 0–40)
Albumin/Globulin Ratio: 2.1 (ref 1.2–2.2)
Albumin: 4.5 g/dL (ref 3.8–4.9)
Alkaline Phosphatase: 55 IU/L (ref 39–117)
BUN/Creatinine Ratio: 10 (ref 9–20)
BUN: 15 mg/dL (ref 6–24)
Bilirubin Total: 0.2 mg/dL (ref 0.0–1.2)
CO2: 21 mmol/L (ref 20–29)
Calcium: 9.5 mg/dL (ref 8.7–10.2)
Chloride: 108 mmol/L — ABNORMAL HIGH (ref 96–106)
Creatinine, Ser: 1.46 mg/dL — ABNORMAL HIGH (ref 0.76–1.27)
GFR calc Af Amer: 62 mL/min/{1.73_m2} (ref >59)
GFR calc non Af Amer: 54 mL/min/{1.73_m2} — ABNORMAL LOW (ref >59)
Globulin, Total: 2.1 g/dL (ref 1.5–4.5)
Glucose: 86 mg/dL (ref 65–99)
Potassium: 4.4 mmol/L (ref 3.5–5.2)
Sodium: 142 mmol/L (ref 134–144)
Total Protein: 6.6 g/dL (ref 6.0–8.5)

## 2019-04-28 LAB — HEMOGLOBIN A1C
Est. average glucose Bld gHb Est-mCnc: 128 mg/dL
Hgb A1c MFr Bld: 6.1 % — ABNORMAL HIGH (ref 4.8–5.6)

## 2019-04-28 LAB — LIPID PANEL
Chol/HDL Ratio: 4 ratio (ref 0.0–5.0)
Cholesterol, Total: 156 mg/dL (ref 100–199)
HDL: 39 mg/dL — ABNORMAL LOW (ref >39)
LDL Chol Calc (NIH): 91 mg/dL (ref 0–99)
Triglycerides: 145 mg/dL (ref 0–149)
VLDL Cholesterol Cal: 26 mg/dL (ref 5–40)

## 2019-04-28 LAB — CBC
Hematocrit: 43.7 % (ref 37.5–51.0)
Hemoglobin: 14.2 g/dL (ref 13.0–17.7)
MCH: 29.6 pg (ref 26.6–33.0)
MCHC: 32.5 g/dL (ref 31.5–35.7)
MCV: 91 fL (ref 79–97)
Platelets: 177 10*3/uL (ref 150–450)
RBC: 4.79 x10E6/uL (ref 4.14–5.80)
RDW: 13.5 % (ref 11.6–15.4)
WBC: 5.9 10*3/uL (ref 3.4–10.8)

## 2019-04-29 ENCOUNTER — Encounter: Payer: Self-pay | Admitting: Medical

## 2019-06-08 ENCOUNTER — Encounter: Payer: Self-pay | Admitting: Gastroenterology

## 2019-06-17 ENCOUNTER — Other Ambulatory Visit: Payer: Self-pay

## 2019-06-17 ENCOUNTER — Ambulatory Visit (AMBULATORY_SURGERY_CENTER): Payer: BC Managed Care – PPO | Admitting: *Deleted

## 2019-06-17 VITALS — Temp 96.2°F | Ht 73.0 in | Wt 238.0 lb

## 2019-06-17 DIAGNOSIS — Z1159 Encounter for screening for other viral diseases: Secondary | ICD-10-CM

## 2019-06-17 DIAGNOSIS — Z86010 Personal history of colonic polyps: Secondary | ICD-10-CM

## 2019-06-17 MED ORDER — NA SULFATE-K SULFATE-MG SULF 17.5-3.13-1.6 GM/177ML PO SOLN: ORAL | 0 refills | Status: DC

## 2019-06-17 NOTE — Progress Notes (Signed)
Patient is here in-person for PV. Patient denies any allergies to eggs or soy. Patient denies any problems with anesthesia/sedation. Patient denies any oxygen use at home. Patient denies taking any diet/weight loss medications or blood thinners. Patient is not being treated for MRSA or C-diff. EMMI education assisgned to the patient for the procedure, this was explained and instructions given to patient. COVID-19 screening test is on 12/15 at 1135am, the pt is aware. Pt is aware that care partner will wait in the car during procedure; if they feel like they will be too hot or cold to wait in the car; they may wait in the 4 th floor lobby. Patient is aware to bring only one care partner. We want them to wear a mask (we do not have any that we can provide them), practice social distancing, and we will check their temperatures when they get here.  I did remind the patient that their care partner needs to stay in the parking lot the entire time and have a cell phone available, we will call them when the pt is ready for discharge. Patient will wear mask into building.    Suprep $15 off coupon given to the patient.

## 2019-06-18 ENCOUNTER — Encounter: Payer: Self-pay | Admitting: Gastroenterology

## 2019-06-24 ENCOUNTER — Other Ambulatory Visit: Payer: Self-pay | Admitting: Gastroenterology

## 2019-06-25 LAB — SARS CORONAVIRUS 2 (TAT 6-24 HRS): SARS Coronavirus 2: NEGATIVE

## 2019-06-26 ENCOUNTER — Ambulatory Visit (AMBULATORY_SURGERY_CENTER): Payer: BC Managed Care – PPO | Admitting: Gastroenterology

## 2019-06-26 ENCOUNTER — Other Ambulatory Visit: Payer: Self-pay

## 2019-06-26 ENCOUNTER — Encounter: Payer: Self-pay | Admitting: Gastroenterology

## 2019-06-26 VITALS — BP 118/72 | HR 62 | Temp 97.4°F | Resp 15 | Ht 73.0 in | Wt 238.0 lb

## 2019-06-26 DIAGNOSIS — D120 Benign neoplasm of cecum: Secondary | ICD-10-CM

## 2019-06-26 DIAGNOSIS — Z86010 Personal history of colonic polyps: Secondary | ICD-10-CM | POA: Diagnosis present

## 2019-06-26 DIAGNOSIS — D122 Benign neoplasm of ascending colon: Secondary | ICD-10-CM | POA: Diagnosis not present

## 2019-06-26 MED ORDER — SODIUM CHLORIDE 0.9 % IV SOLN: 500.0000 mL | Freq: Once | INTRAVENOUS | Status: DC

## 2019-06-26 NOTE — Progress Notes (Signed)
To PACU, VSS. Report to [email or web address] 

## 2019-06-26 NOTE — Progress Notes (Signed)
VS-DT Temp-JB   Pt's states no medical or surgical changes since previsit or office visit.  

## 2019-06-26 NOTE — Progress Notes (Signed)
Called to room to assist during endoscopic procedure.  Patient ID and intended procedure confirmed with present staff. Received instructions for my participation in the procedure from the performing physician.  

## 2019-06-26 NOTE — Patient Instructions (Signed)
One polyp removed today.  Dr Havery Moros will mail you a letter about pathology in 1-3 weeks. Diverticulosis and internal hemorrhoids were noted.      YOU HAD AN ENDOSCOPIC PROCEDURE TODAY AT [facility]:   Refer to the procedure report that was given to you for any specific questions about what was found during the examination.  If the procedure report does not answer your questions, please call your gastroenterologist to clarify.  If you requested that your care partner not be given the details of your procedure findings, then the procedure report has been included in a sealed envelope for you to review at your convenience later.  YOU SHOULD EXPECT: Some feelings of bloating in the abdomen. Passage of more gas than usual.  Walking can help get rid of the air that was put into your GI tract during the procedure and reduce the bloating. If you had a lower endoscopy (such as a colonoscopy or flexible sigmoidoscopy) you may notice spotting of blood in your stool or on the toilet paper. If you underwent a bowel prep for your procedure, you may not have a normal bowel movement for a few days.  Please Note:  You might notice some irritation and congestion in your nose or some drainage.  This is from the oxygen used during your procedure.  There is no need for concern and it should clear up in a day or so.  SYMPTOMS TO REPORT IMMEDIATELY:   Following lower endoscopy (colonoscopy or flexible sigmoidoscopy):  Excessive amounts of blood in the stool  Significant tenderness or worsening of abdominal pains  Swelling of the abdomen that is new, acute  Fever of 100F or higher   For urgent or emergent issues, a gastroenterologist can be reached at any hour by calling [phone].   DIET:  We do recommend a small meal at first, but then you may proceed to your regular diet.  Drink plenty of fluids but you should avoid alcoholic beverages for 24 hours.  ACTIVITY:  You should plan to  take it easy for the rest of today and you should NOT DRIVE or use heavy machinery until tomorrow (because of the sedation medicines used during the test).    FOLLOW UP: Our staff will call the number listed on your records 48-72 hours following your procedure to check on you and address any questions or concerns that you may have regarding the information given to you following your procedure. If we do not reach you, we will leave a message.  We will attempt to reach you two times.  During this call, we will ask if you have developed any symptoms of COVID 19. If you develop any symptoms (ie: fever, flu-like symptoms, shortness of breath, cough etc.) before then, please call [phone].  If you test positive for Covid 19 in the 2 weeks post procedure, please call and report this information to [place].    If any biopsies were taken you will be contacted by phone or by letter within the next 1-3 weeks.  Please call us at [phone] if you have not heard about the biopsies in 3 weeks.    SIGNATURES/CONFIDENTIALITY: You and/or your care partner have signed paperwork which will be entered into your electronic medical record.  These signatures attest to the fact that that the information above on your After Visit Summary has been reviewed and is understood.  Full responsibility of the confidentiality of this discharge information lies with you and/or your  care-partner.

## 2019-06-26 NOTE — Op Note (Signed)
[place] Patient Name: Alexander Dean Procedure Date: 06/26/2019 8:35 AM MRN: [id number] Endoscopist: Remo Lipps P. Havery Moros , MD Age: 53 Referring MD:  Date of Birth: 1966-03-05 Gender: Male Account #: [id number] Procedure:                Colonoscopy Indications:              Surveillance: Personal history of 3 adenomatous                            polyps on last colonoscopy 3 years ago Medicines:                Monitored Anesthesia Care Procedure:                Pre-Anesthesia Assessment:                           - Prior to the procedure, a History and Physical                            was performed, and patient medications and                            allergies were reviewed. The patient's tolerance of                            previous anesthesia was also reviewed. The risks                            and benefits of the procedure and the sedation                            options and risks were discussed with the patient.                            All questions were answered, and informed consent                            was obtained. Prior Anticoagulants: The patient has                            taken no previous anticoagulant or antiplatelet                            agents. ASA Grade Assessment: II - A patient with                            mild systemic disease. After reviewing the risks                            and benefits, the patient was deemed in                            satisfactory condition to undergo the procedure.  After obtaining informed consent, the colonoscope                            was passed under direct vision. Throughout the                            procedure, the patient's blood pressure, pulse, and                            oxygen saturations were monitored continuously. The                            Colonoscope was introduced through the anus and                            advanced to the the  cecum, identified by                            appendiceal orifice and ileocecal valve. The                            colonoscopy was performed without difficulty. The                            patient tolerated the procedure well. The quality                            of the bowel preparation was good. The ileocecal                            valve, appendiceal orifice, and rectum were                            photographed. Scope In: 8:41:15 AM Scope Out: 8:56:27 AM Scope Withdrawal Time: 0 hours 12 minutes 53 seconds  Total Procedure Duration: 0 hours 15 minutes 12 seconds  Findings:                 The perianal and digital rectal examinations were                            normal.                           A 3 mm polyp was found in the cecum. The polyp was                            sessile. The polyp was removed with a cold snare.                            Resection and retrieval were complete.                           A few small-mouthed diverticula were found in the  sigmoid colon.                           Internal hemorrhoids were found during retroflexion.                           The exam was otherwise without abnormality. Complications:            No immediate complications. Estimated blood loss:                            Minimal. Estimated Blood Loss:     Estimated blood loss was minimal. Impression:               - One 3 mm polyp in the cecum, removed with a cold                            snare. Resected and retrieved.                           - Diverticulosis in the sigmoid colon.                           - Internal hemorrhoids.                           - The examination was otherwise normal. Recommendation:           - Patient has a contact number available for                            emergencies. The signs and symptoms of potential                            delayed complications were discussed with the                             patient. Return to normal activities tomorrow.                            Written discharge instructions were provided to the                            patient.                           - Resume previous diet.                           - Continue present medications.                           - Await pathology results. Remo Lipps P. Devinn Hurwitz, MD 06/26/2019 9:00:35 AM This report has been signed electronically.

## 2019-06-30 ENCOUNTER — Telehealth: Payer: Self-pay | Admitting: *Deleted

## 2019-06-30 NOTE — Telephone Encounter (Signed)
  Follow up Call-  Call back number 06/26/2019  Post procedure Call Back phone  # [phone]  Permission to leave phone message Yes  Some recent data might be hidden     Patient questions:  Do you have a fever, pain , or abdominal swelling? No. Pain Score  0 *  Have you tolerated food without any problems? Yes.    Have you been able to return to your normal activities? Yes.    Do you have any questions about your discharge instructions: Diet   No. Medications  No. Follow up visit  No.  Do you have questions or concerns about your Care? No.  Actions: * If pain score is 4 or above: 1. No action needed, pain <4.Have you developed a fever since your procedure? no  2.   Have you had an respiratory symptoms (SOB or cough) since your procedure? no  3.   Have you tested positive for COVID 19 since your procedure no  4.   Have you had any family members/close contacts diagnosed with the COVID 19 since your procedure?  no   If yes to any of these questions please route to Joylene John, RN and Alphonsa Gin, Therapist, sports.

## 2019-07-01 ENCOUNTER — Encounter: Payer: Self-pay | Admitting: Gastroenterology

## 2019-10-01 ENCOUNTER — Ambulatory Visit: Payer: BC Managed Care – PPO | Attending: Family

## 2019-10-01 DIAGNOSIS — Z23 Encounter for immunization: Secondary | ICD-10-CM

## 2019-10-01 NOTE — Progress Notes (Signed)
   Covid-19 Vaccination Clinic  Name:  Alexander Dean    MRN: [id number] DOB: 02-08-1966  10/01/2019  [name] was observed post Covid-19 immunization for 15 minutes without incident. He was provided with Vaccine Information Sheet and instruction to access the V-Safe system.   [name] was instructed to call 911 with any severe reactions post vaccine: Marland Kitchen Difficulty breathing  . Swelling of face and throat  . A fast heartbeat  . A bad rash all over body  . Dizziness and weakness   Immunizations Administered    Name Date Dose VIS Date Route   Moderna COVID-19 Vaccine 10/01/2019  3:41 PM 0.5 mL 06/09/2019 Intramuscular   Manufacturer: Moderna   Lot: [id number]   [id number]

## 2019-11-03 ENCOUNTER — Ambulatory Visit: Payer: BC Managed Care – PPO | Attending: Family

## 2019-11-03 DIAGNOSIS — Z23 Encounter for immunization: Secondary | ICD-10-CM

## 2019-11-03 NOTE — Progress Notes (Signed)
   Covid-19 Vaccination Clinic  Name:  Alexander Dean    MRN: [id number] DOB: 04/05/1966  11/03/2019  Alexander Dean was observed post Covid-19 immunization for 15 minutes without incident. He was provided with Vaccine Information Sheet and instruction to access the V-Safe system.   Alexander Dean was instructed to call 911 with any severe reactions post vaccine: Marland Kitchen Difficulty breathing  . Swelling of face and throat  . A fast heartbeat  . A bad rash all over body  . Dizziness and weakness   Immunizations Administered    Name Date Dose VIS Date Route   Moderna COVID-19 Vaccine 11/03/2019 10:07 AM 0.5 mL 06/2019 Intramuscular   Manufacturer: Moderna   Lot: [id number]   [id number]

## 2020-06-07 ENCOUNTER — Other Ambulatory Visit: Payer: Self-pay

## 2020-06-07 ENCOUNTER — Encounter: Payer: Self-pay | Admitting: Medical

## 2020-06-07 ENCOUNTER — Ambulatory Visit: Payer: BC Managed Care – PPO | Admitting: Medical

## 2020-06-07 VITALS — BP 118/80 | HR 59 | Ht 73.0 in | Wt 240.0 lb

## 2020-06-07 DIAGNOSIS — G8929 Other chronic pain: Secondary | ICD-10-CM

## 2020-06-07 DIAGNOSIS — Z6838 Body mass index (BMI) 38.0-38.9, adult: Secondary | ICD-10-CM

## 2020-06-07 DIAGNOSIS — K5909 Other constipation: Secondary | ICD-10-CM

## 2020-06-07 DIAGNOSIS — J4520 Mild intermittent asthma, uncomplicated: Secondary | ICD-10-CM | POA: Diagnosis not present

## 2020-06-07 DIAGNOSIS — Z7185 Encounter for immunization safety counseling: Secondary | ICD-10-CM

## 2020-06-07 DIAGNOSIS — Z13220 Encounter for screening for lipoid disorders: Secondary | ICD-10-CM

## 2020-06-07 DIAGNOSIS — R7989 Other specified abnormal findings of blood chemistry: Secondary | ICD-10-CM

## 2020-06-07 DIAGNOSIS — D369 Benign neoplasm, unspecified site: Secondary | ICD-10-CM

## 2020-06-07 DIAGNOSIS — Z125 Encounter for screening for malignant neoplasm of prostate: Secondary | ICD-10-CM

## 2020-06-07 DIAGNOSIS — IMO0002 Reserved for concepts with insufficient information to code with codable children: Secondary | ICD-10-CM

## 2020-06-07 DIAGNOSIS — R7301 Impaired fasting glucose: Secondary | ICD-10-CM

## 2020-06-07 DIAGNOSIS — K4090 Unilateral inguinal hernia, without obstruction or gangrene, not specified as recurrent: Secondary | ICD-10-CM

## 2020-06-07 DIAGNOSIS — J301 Allergic rhinitis due to pollen: Secondary | ICD-10-CM

## 2020-06-07 DIAGNOSIS — R972 Elevated prostate specific antigen [PSA]: Secondary | ICD-10-CM

## 2020-06-07 DIAGNOSIS — Z0000 Encounter for general adult medical examination without abnormal findings: Secondary | ICD-10-CM | POA: Diagnosis not present

## 2020-06-07 DIAGNOSIS — Z2821 Immunization not carried out because of patient refusal: Secondary | ICD-10-CM

## 2020-06-07 DIAGNOSIS — N400 Enlarged prostate without lower urinary tract symptoms: Secondary | ICD-10-CM

## 2020-06-07 DIAGNOSIS — M25561 Pain in right knee: Secondary | ICD-10-CM

## 2020-06-07 LAB — POCT URINALYSIS DIP (PROADVANTAGE DEVICE)
Bilirubin, UA: NEGATIVE
Glucose, UA: NEGATIVE mg/dL
Ketones, POC UA: NEGATIVE mg/dL
Leukocytes, UA: NEGATIVE
Nitrite, UA: NEGATIVE
Protein Ur, POC: NEGATIVE mg/dL
Specific Gravity, Urine: 1.025
Urobilinogen, Ur: 0.2
pH, UA: 5.5 (ref 5.0–8.0)

## 2020-06-07 LAB — COMPREHENSIVE METABOLIC PANEL

## 2020-06-07 LAB — CBC: Hemoglobin: 15 g/dL (ref 13.0–17.7)

## 2020-06-07 NOTE — Progress Notes (Signed)
Subjective:   HPI  Alexander Dean is a 54 y.o. male who presents for Chief Complaint  Patient presents with  . Annual Exam    with fasting labs     Patient Care Team: Meshell Abdulaziz, Leward Quan as PCP - General (Family Medicine) Sees dentist Sees eye doctor Dr. Falls Cellar, GI  Concerns: Had updated colonoscopy 06/2019, tubular adenoma found.    otherwise doing well.  Asthma - no recent problems  Has quit cigars   Reviewed their medical, surgical, family, social, medication, and allergy history and updated chart as appropriate.  Past Medical History:  Diagnosis Date  . Allergy   . Asthma    childhood  . BPH (benign prostatic hypertrophy)    per exam and PSA, asymptomatic  . Cigar smoker   . Environmental allergies   . History of kidney stones   . Seasonal allergic rhinitis   . Wears glasses     Past Surgical History:  Procedure Laterality Date  . APPENDECTOMY  1983  . COLONOSCOPY  06/2016   tubular adenoma, repeat 3 years, Dr. Havery Moros  . EXTRACORPOREAL SHOCK WAVE LITHOTRIPSY Right 07/03/2018   Procedure: EXTRACORPOREAL SHOCK WAVE LITHOTRIPSY (ESWL);  Surgeon: Alexis Frock, MD;  Location: [facility];  Service: Urology;  Laterality: Right;  . POLYPECTOMY    . WISDOM TOOTH EXTRACTION      Family History  Problem Relation Age of Onset  . Other Mother        renal stones  . Diabetes Mother   . Hypertension Mother   . Arthritis Father        knees  . Other Father        renal stones  . Cancer Neg Hx   . Hyperlipidemia Neg Hx   . Colon cancer Neg Hx   . Heart disease Neg Hx   . Stroke Neg Hx   . Colon polyps Neg Hx   . Esophageal cancer Neg Hx   . Rectal cancer Neg Hx   . Stomach cancer Neg Hx     No current outpatient medications on file.  Allergies  Allergen Reactions  . Other Anaphylaxis    peanuts  . Shellfish Allergy Anaphylaxis       Review of Systems Constitutional: -fever, -chills, -sweats, -unexpected weight change,  -decreased appetite, -fatigue Allergy: -sneezing, -itching, -congestion Dermatology: -changing moles, --rash, -lumps ENT: -runny nose, -ear pain, -sore throat, -hoarseness, -sinus pain, -teeth pain, - ringing in ears, -hearing loss, -nosebleeds Cardiology: -chest pain, -palpitations, -swelling, -difficulty breathing when lying flat, -waking up short of breath Respiratory: -cough, -shortness of breath, -difficulty breathing with exercise or exertion, -wheezing, -coughing up blood Gastroenterology: -abdominal pain, -nausea, -vomiting, -diarrhea, -constipation, -blood in stool, -changes in bowel movement, -difficulty swallowing or eating Hematology: -bleeding, -bruising  Musculoskeletal: -joint aches, -muscle aches, -joint swelling, -back pain, -neck pain, -cramping, -changes in gait Ophthalmology: denies vision changes, eye redness, itching, discharge Urology: -burning with urination, -difficulty urinating, -blood in urine, -urinary frequency, -urgency, -incontinence Neurology: -headache, -weakness, -tingling, -numbness, -memory loss, -falls, -dizziness Psychology: -depressed mood, -agitation, -sleep problems Male GU: no testicular mass, pain, no lymph nodes swollen, no swelling, no rash.     Objective:  BP 118/80   Pulse (!) 59   Ht 6\' 1"  (1.854 m)   Wt 240 lb (108.9 kg)   SpO2 96%   BMI 31.66 kg/m   Wt Readings from Last 3 Encounters:  06/07/20 240 lb (108.9 kg)  06/26/19 238 lb (108 kg)  06/17/19 238 lb (108 kg)    General appearance: alert, no distress, WD/WN, African American male Skin: unremarkable HEENT: normocephalic, conjunctiva/corneas normal, sclerae anicteric, PERRLA, EOMi, nares patent, no discharge or erythema, pharynx normal Oral cavity: MMM, tongue normal, teeth normal Neck: supple, no lymphadenopathy, no thyromegaly, no masses, normal ROM, no bruits Chest: non tender, normal shape and expansion Heart: RRR, normal S1, S2, no murmurs Lungs: CTA bilaterally, no  wheezes, rhonchi, or rales Abdomen: +bs, soft, non tender, non distended, no masses, no hepatomegaly, no splenomegaly, no bruits Back: non tender, normal ROM, no scoliosis Musculoskeletal: upper extremities non tender, no obvious deformity, normal ROM throughout, lower extremities non tender, no obvious deformity, normal ROM throughout Extremities: no edema, no cyanosis, no clubbing Pulses: 2+ symmetric, upper and lower extremities, normal cap refill Neurological: alert, oriented x 3, CN2-12 intact, strength normal upper extremities and lower extremities, sensation normal throughout, DTRs 2+ throughout, no cerebellar signs, gait normal Psychiatric: normal affect, behavior normal, pleasant  GU: normal male external genitalia,circumcised, nontender, no masses, no hernia, no lymphadenopathy Rectal: anus normal tone, prostate mild to moderate enlarged   Assessment and Plan :   Encounter Diagnoses  Name Primary?  . Routine general medical examination at a health care facility Yes  . BMI 38.0-38.9,adult   . Mild intermittent asthma, unspecified whether complicated   . Allergic rhinitis due to pollen, unspecified seasonality   . Chronic constipation   . Impaired fasting blood sugar   . BPH without urinary obstruction   . Chronic pain of right knee   . Creatinine elevation   . Elevated PSA   . Influenza vaccination declined   . Vaccine counseling   . Left inguinal hernia   . Screening for lipid disorders   . Screening for prostate cancer   . Tubular adenoma     Physical exam - discussed and counseled on healthy lifestyle, diet, exercise, preventative care, vaccinations, sick and well care, proper use of emergency dept and after hours care, and addressed their concerns.    Health screening: See your eye doctor yearly for routine vision care. See your dentist yearly for routine dental care including hygiene visits twice yearly.  Cancer screening Colonoscopy:  Reviewed colonoscopy on  file that is up to date  Discussed PSA, prostate exam, and prostate cancer screening risks/benefits.      Vaccinations: Advised yearly influenza vaccine Declines flu shot Up to date on Tetanus and covid vaccine  Shingles vaccine:  I recommend you have a shingles vaccine to help prevent shingles or herpes zoster outbreak.   Please call your insurer to inquire about coverage for the Shingrix vaccine given in 2 doses.   Some insurers cover this vaccine after age 15, some cover this after age 69.  If your insurer covers this, then call to schedule appointment to have this vaccine here.   Separate significant issues discussed: Inguinal hernia left - refer to general surgery  BPH - labs today  Asthma - mild, no recent issues  Impaired glucose - counseled on diet, exercise, need for weight loss  Elevated creatinine - recheck labs, avoid NSAIDs and nephrotoxic substances    Eluterio was seen today for annual exam.  Diagnoses and all orders for this visit:  Routine general medical examination at a health care facility -     Comprehensive metabolic panel -     CBC -     PSA -     Hemoglobin A1c -     Lipid panel -  Microalbumin/Creatinine Ratio, Urine  BMI 38.0-38.9,adult  Mild intermittent asthma, unspecified whether complicated  Allergic rhinitis due to pollen, unspecified seasonality  Chronic constipation  Impaired fasting blood sugar -     Hemoglobin A1c  BPH without urinary obstruction -     PSA  Chronic pain of right knee  Creatinine elevation  Elevated PSA  Influenza vaccination declined  Vaccine counseling  Left inguinal hernia -     Ambulatory referral to General Surgery  Screening for lipid disorders -     Lipid panel  Screening for prostate cancer -     PSA  Tubular adenoma    Follow-up pending labs, yearly for physical

## 2020-06-07 NOTE — Addendum Note (Signed)
Addended by: Edgar Frisk on: 06/07/2020 01:35 PM   Modules accepted: Orders

## 2020-06-07 NOTE — Progress Notes (Signed)
Referral has been faxed.

## 2020-06-08 LAB — LIPID PANEL
Chol/HDL Ratio: 4.2 ratio (ref 0.0–5.0)
Cholesterol, Total: 157 mg/dL (ref 100–199)
HDL: 37 mg/dL — ABNORMAL LOW (ref >39)
LDL Chol Calc (NIH): 86 mg/dL (ref 0–99)
Triglycerides: 200 mg/dL — ABNORMAL HIGH (ref 0–149)
VLDL Cholesterol Cal: 34 mg/dL (ref 5–40)

## 2020-06-08 LAB — COMPREHENSIVE METABOLIC PANEL
ALT: 26 IU/L (ref 0–44)
Albumin/Globulin Ratio: 1.6 (ref 1.2–2.2)
Albumin: 4.5 g/dL (ref 3.8–4.9)
Alkaline Phosphatase: 69 IU/L (ref 44–121)
BUN/Creatinine Ratio: 14 (ref 9–20)
BUN: 20 mg/dL (ref 6–24)
Bilirubin Total: 0.2 mg/dL (ref 0.0–1.2)
CO2: 27 mmol/L (ref 20–29)
Calcium: 9.6 mg/dL (ref 8.7–10.2)
Chloride: 101 mmol/L (ref 96–106)
Creatinine, Ser: 1.47 mg/dL — ABNORMAL HIGH (ref 0.76–1.27)
GFR calc Af Amer: 61 mL/min/{1.73_m2} (ref >59)
Globulin, Total: 2.8 g/dL (ref 1.5–4.5)
Glucose: 90 mg/dL (ref 65–99)
Potassium: 4.6 mmol/L (ref 3.5–5.2)
Sodium: 141 mmol/L (ref 134–144)
Total Protein: 7.3 g/dL (ref 6.0–8.5)

## 2020-06-08 LAB — CBC
Hematocrit: 44.3 % (ref 37.5–51.0)
MCH: 30.8 pg (ref 26.6–33.0)
MCHC: 33.9 g/dL (ref 31.5–35.7)
MCV: 91 fL (ref 79–97)
Platelets: 224 10*3/uL (ref 150–450)
RBC: 4.87 x10E6/uL (ref 4.14–5.80)
RDW: 13.1 % (ref 11.6–15.4)
WBC: 9.9 10*3/uL (ref 3.4–10.8)

## 2020-06-08 LAB — MICROALBUMIN / CREATININE URINE RATIO
Creatinine, Urine: 137.2 mg/dL
Microalb/Creat Ratio: 3 mg/g creat (ref 0–29)
Microalbumin, Urine: 4 ug/mL

## 2020-06-08 LAB — HEMOGLOBIN A1C
Est. average glucose Bld gHb Est-mCnc: 137 mg/dL
Hgb A1c MFr Bld: 6.4 % — ABNORMAL HIGH (ref 4.8–5.6)

## 2020-06-08 LAB — PSA: Prostate Specific Ag, Serum: 4.3 ng/mL — ABNORMAL HIGH (ref 0.0–4.0)

## 2020-06-15 ENCOUNTER — Encounter: Payer: BC Managed Care – PPO | Admitting: Medical

## 2020-06-15 ENCOUNTER — Other Ambulatory Visit: Payer: Self-pay | Admitting: Medical

## 2020-06-15 MED ORDER — DAPAGLIFLOZIN PROPANEDIOL 5 MG PO TABS: 5.0000 mg | ORAL_TABLET | Freq: Every day | ORAL | 2 refills | Status: DC

## 2020-06-29 ENCOUNTER — Ambulatory Visit: Payer: BC Managed Care – PPO | Attending: Family

## 2020-06-29 DIAGNOSIS — Z23 Encounter for immunization: Secondary | ICD-10-CM

## 2020-09-14 ENCOUNTER — Other Ambulatory Visit: Payer: Self-pay | Admitting: Medical

## 2020-11-02 NOTE — Progress Notes (Signed)
   Covid-19 Vaccination Clinic  Name:  Alexander Dean    MRN: [id number] DOB: 1965/10/03  11/02/2020  [name] was observed post Covid-19 immunization for 15 minutes without incident. He was provided with Vaccine Information Sheet and instruction to access the V-Safe system.   [name] was instructed to call 911 with any severe reactions post vaccine: Marland Kitchen Difficulty breathing  . Swelling of face and throat  . A fast heartbeat  . A bad rash all over body  . Dizziness and weakness   Immunizations Administered    Name Date Dose VIS Date Route   Moderna Covid-19 Booster Vaccine 06/29/2020  9:30 AM 0.25 mL 04/27/2020 Intramuscular   Manufacturer: Moderna   Lot: [id number]   [place]: [id number]

## 2021-01-01 ENCOUNTER — Other Ambulatory Visit: Payer: Self-pay | Admitting: Medical

## 2021-03-13 ENCOUNTER — Other Ambulatory Visit: Payer: Self-pay | Admitting: Medical

## 2021-06-14 ENCOUNTER — Encounter: Payer: BC Managed Care – PPO | Admitting: Medical

## 2021-06-23 ENCOUNTER — Ambulatory Visit (INDEPENDENT_AMBULATORY_CARE_PROVIDER_SITE_OTHER): Payer: Self-pay | Admitting: Medical

## 2021-06-23 ENCOUNTER — Other Ambulatory Visit (INDEPENDENT_AMBULATORY_CARE_PROVIDER_SITE_OTHER): Payer: BC Managed Care – PPO

## 2021-06-23 ENCOUNTER — Other Ambulatory Visit: Payer: Self-pay

## 2021-06-23 VITALS — BP 120/70 | HR 53 | Ht 73.0 in | Wt 233.0 lb

## 2021-06-23 DIAGNOSIS — J45909 Unspecified asthma, uncomplicated: Secondary | ICD-10-CM | POA: Insufficient documentation

## 2021-06-23 DIAGNOSIS — Z973 Presence of spectacles and contact lenses: Secondary | ICD-10-CM | POA: Insufficient documentation

## 2021-06-23 DIAGNOSIS — R7301 Impaired fasting glucose: Secondary | ICD-10-CM

## 2021-06-23 DIAGNOSIS — Z0289 Encounter for other administrative examinations: Secondary | ICD-10-CM

## 2021-06-23 DIAGNOSIS — Z87442 Personal history of urinary calculi: Secondary | ICD-10-CM | POA: Insufficient documentation

## 2021-06-23 DIAGNOSIS — Z23 Encounter for immunization: Secondary | ICD-10-CM | POA: Diagnosis not present

## 2021-06-23 LAB — POCT URINALYSIS DIP (PROADVANTAGE DEVICE)
Bilirubin, UA: NEGATIVE
Glucose, UA: 500 mg/dL — AB
Ketones, POC UA: NEGATIVE mg/dL
Leukocytes, UA: NEGATIVE
Nitrite, UA: NEGATIVE
Protein Ur, POC: NEGATIVE mg/dL
Specific Gravity, Urine: 1.02
Urobilinogen, Ur: NEGATIVE
pH, UA: 6 (ref 5.0–8.0)

## 2021-06-23 LAB — GLUCOSE, POCT (MANUAL RESULT ENTRY): POC Glucose: 96 mg/dl (ref 70–99)

## 2021-06-23 NOTE — Progress Notes (Signed)
Commercial Driver Medical Examination   Alexander Dean is a 55 y.o. male who presents today for a commercial driver fitness determination physical exam.  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  Medical care team includes: Dayvin Aber, Camelia Eng, PA-C here for  primary care  The patient reports no problems  Review of Systems A comprehensive review of systems was reviewed and noted as below:  Eye: - corrective lenses, -lasik surgery or other eye surgery, -glaucoma, -cataracts, -macular degeneration, -monocular vision, -medication for eye condition, -blurred vision,   Ears: -hearing problems, - hearing aids, -ear pain, -ear drainage, -ear fullness, -tinnitus, -recurrent ear infection, -previous ear surgery, - vertigo, -menieres disease  Endocrine: -polydipsia, -polyuria, -weight loss, -fainting, -dizziness, - altered or loss of consciousness, -hypoglycemia  Cardiovascular: -heart disease, -CHF, -heart attack, -cardiac stents, -bypass surgery, -other heart surgery, -hypertension, -blood clots, -pacemaker, -medications for heart condition, -chest pain, -SOB, -palpitations, -fainting, -dizziness, -dyspnea  Respiratory: -asthma, -COPD, other lung disease, -smoker, -chest tightness, - wheezing, -snoring, -daytime sleepiness, -sleep apnea or uses CPAP, -narcolepsy, -sleeping disorder  Allergy: -uncontrollable sneezing or allergy symptoms  Musculoskeletal: -missing body parts, -muscle disease, -bone disease, -spine injury, -low back pain, -medication for joints, bones, muscles or pain, -physical limitations, -joint pain, -neck pain, -limitations of neck ROM, -back surgery, orthopedic surgery, -rheumatologic condition, -gout  Neurologic: -neurologic disease, -dementia, -seizures, -parkinsons, -tremor, -memory problems, -weakness, -numbness, -tingling, -medication for neurologic condition, -medications for sleep condition  Gastric: -abdominal  pain, -chronic diarrhea or IBS, -uncontrollable nausea  Kidney/Renal: -hematuria, -dialysis, kidney disease, polycystic kidney disease  Psychiatric: -homicidal thoughts, -suicidal thoughts, -prior suicide attempts, -get into fights/hurting others, -memory or concentration problems, -delusions, -hallucinations, -hospitalizaiton for mental health problem, -depression, -anxiety, -bipolar  Drug use: - none   Reviewed their medical, surgical, family, social, medication, and allergy history and updated chart as appropriate.        Objective:   Physical Exam  BP 120/70    Pulse (!) 53 Comment: walks alot   Ht 6\' 1"  (1.854 m)    Wt 233 lb (105.7 kg)    BMI 30.74 kg/m   General appearance: alert, no distress, WD/WN, African-American male, wearing glasses Skin: Unremarkable HEENT: normocephalic, conjunctiva/corneas normal, sclerae anicteric, PERRLA, EOMi Neck: supple, no lymphadenopathy, no thyromegaly, no masses, normal ROM Chest: non tender, normal shape and expansion Heart: RRR, normal S1, S2, no murmurs Lungs: CTA bilaterally, no wheezes, rhonchi, or rales Abdomen: +bs, soft, non tender, non distended, no masses, no hepatomegaly, no splenomegaly, no bruits Back: non tender, normal ROM, no scoliosis Musculoskeletal: upper extremities non tender, no obvious deformity, normal ROM throughout, lower extremities non tender, no obvious deformity, normal ROM throughout Extremities: no edema, no cyanosis, no clubbing Pulses: 2+ symmetric, upper and lower extremities, normal cap refill Neurological: alert, oriented x 3, CN2-12 intact, strength normal upper extremities and lower extremities, sensation normal throughout, DTRs 2+ throughout, no cerebellar signs, gait normal Psychiatric: normal affect, behavior normal, pleasant  GU: normal male external genitalia, nontender, no masses, no hernia, no lymphadenopathy Rectal: Deferred   Assessment:   Encounter Diagnoses  Name Primary?   Health  examination of defined subpopulation Yes   Impaired fasting blood sugar    History of kidney stones    Wears glasses       Plan:    He has been on Farxiga in the past year for weight loss and impaired glucose.  Glucose today within normal  limits.  He has been rationing down the [place] as he has no refills left from several months ago.  On his labs last year he was showing some impaired glucose.  He has no worrisome symptoms and no official diagnosis of diabetes.  We discussed allergies and preventing sneezing fits.  We discussed wearing his glasses when driving as he has corrective lenses  He will return soon for routine fasting physical.  However he has no restrictions that would disqualify him for DOT certification  F/u soon for routine fasting physical

## 2021-08-02 ENCOUNTER — Encounter: Payer: Self-pay | Admitting: Medical

## 2021-08-02 ENCOUNTER — Other Ambulatory Visit: Payer: Self-pay

## 2021-08-02 ENCOUNTER — Ambulatory Visit (INDEPENDENT_AMBULATORY_CARE_PROVIDER_SITE_OTHER): Payer: BC Managed Care – PPO | Admitting: Medical

## 2021-08-02 VITALS — BP 120/84 | HR 57 | Ht 73.0 in | Wt 234.8 lb

## 2021-08-02 DIAGNOSIS — Z7185 Encounter for immunization safety counseling: Secondary | ICD-10-CM | POA: Diagnosis not present

## 2021-08-02 DIAGNOSIS — Z125 Encounter for screening for malignant neoplasm of prostate: Secondary | ICD-10-CM

## 2021-08-02 DIAGNOSIS — Z13220 Encounter for screening for lipoid disorders: Secondary | ICD-10-CM

## 2021-08-02 DIAGNOSIS — R7989 Other specified abnormal findings of blood chemistry: Secondary | ICD-10-CM

## 2021-08-02 DIAGNOSIS — J4520 Mild intermittent asthma, uncomplicated: Secondary | ICD-10-CM

## 2021-08-02 DIAGNOSIS — Z87442 Personal history of urinary calculi: Secondary | ICD-10-CM

## 2021-08-02 DIAGNOSIS — K5909 Other constipation: Secondary | ICD-10-CM

## 2021-08-02 DIAGNOSIS — J301 Allergic rhinitis due to pollen: Secondary | ICD-10-CM

## 2021-08-02 DIAGNOSIS — N400 Enlarged prostate without lower urinary tract symptoms: Secondary | ICD-10-CM

## 2021-08-02 DIAGNOSIS — Z0000 Encounter for general adult medical examination without abnormal findings: Secondary | ICD-10-CM | POA: Diagnosis not present

## 2021-08-02 DIAGNOSIS — R7301 Impaired fasting glucose: Secondary | ICD-10-CM

## 2021-08-02 NOTE — Progress Notes (Signed)
Subjective:   HPI  Alexander Dean is a 56 y.o. male who presents for Chief Complaint  Patient presents with   fasting cpe    Fasitng cpe, no concerns    Patient Care Team: Evaluna Utke, Leward Quan as PCP - General (Family Medicine) Sees dentist Sees eye doctor Dr. [place] Cellar, GI  Concerns: Had updated colonoscopy 06/2019, tubular adenoma found.    Asthma - no recent problems   Reviewed their medical, surgical, family, social, medication, and allergy history and updated chart as appropriate.  Past Medical History:  Diagnosis Date   Allergy    Asthma    childhood   BPH (benign prostatic hypertrophy)    per exam and PSA, asymptomatic   Cigar smoker    Environmental allergies    History of kidney stones    Seasonal allergic rhinitis    Wears glasses     Past Surgical History:  Procedure Laterality Date   APPENDECTOMY  1983   COLONOSCOPY  06/2016   tubular adenoma, repeat 3 years, Dr. Havery Moros   EXTRACORPOREAL SHOCK WAVE LITHOTRIPSY Right 07/03/2018   Procedure: EXTRACORPOREAL SHOCK WAVE LITHOTRIPSY (ESWL);  Surgeon: Alexis Frock, MD;  Location: [facility];  Service: Urology;  Laterality: Right;   LAPAROSCOPIC INGUINAL HERNIA REPAIR     POLYPECTOMY     WISDOM TOOTH EXTRACTION      Family History  Problem Relation Age of Onset   Other Mother        renal stones   Diabetes Mother    Hypertension Mother    Arthritis Father        knees   Other Father        renal stones   Cancer Neg Hx    Hyperlipidemia Neg Hx    Colon cancer Neg Hx    Heart disease Neg Hx    Stroke Neg Hx    Colon polyps Neg Hx    Esophageal cancer Neg Hx    Rectal cancer Neg Hx    Stomach cancer Neg Hx     No current outpatient medications on file.  Allergies  Allergen Reactions   Other Anaphylaxis    peanuts   Shellfish Allergy Anaphylaxis     Review of Systems Constitutional: -fever, -chills, -sweats, -unexpected weight change, -decreased appetite, -fatigue Allergy:  -sneezing, -itching, -congestion Dermatology: -changing moles, --rash, -lumps ENT: -runny nose, -ear pain, -sore throat, -hoarseness, -sinus pain, -teeth pain, - ringing in ears, -hearing loss, -nosebleeds Cardiology: -chest pain, -palpitations, -swelling, -difficulty breathing when lying flat, -waking up short of breath Respiratory: -cough, -shortness of breath, -difficulty breathing with exercise or exertion, -wheezing, -coughing up blood Gastroenterology: -abdominal pain, -nausea, -vomiting, -diarrhea, -constipation, -blood in stool, -changes in bowel movement, -difficulty swallowing or eating Hematology: -bleeding, -bruising  Musculoskeletal: -joint aches, -muscle aches, -joint swelling, -back pain, -neck pain, -cramping, -changes in gait Ophthalmology: denies vision changes, eye redness, itching, discharge Urology: -burning with urination, -difficulty urinating, -blood in urine, -urinary frequency, -urgency, -incontinence Neurology: -headache, -weakness, -tingling, -numbness, -memory loss, -falls, -dizziness Psychology: -depressed mood, -agitation, -sleep problems Male GU: no testicular mass, pain, no lymph nodes swollen, no swelling, no rash.     Objective:  BP 120/84    Pulse (!) 57    Ht 6\' 1"  (1.854 m)    Wt 234 lb 12.8 oz (106.5 kg)    BMI 30.98 kg/m   Wt Readings from Last 3 Encounters:  08/02/21 234 lb 12.8 oz (106.5 kg)  06/23/21 233 lb (105.7  kg)  06/07/20 240 lb (108.9 kg)    General appearance: alert, no distress, WD/WN, African American male Skin: unremarkable HEENT: normocephalic, conjunctiva/corneas normal, sclerae anicteric, PERRLA, EOMi Neck: supple, no lymphadenopathy, no thyromegaly, no masses, normal ROM, no bruits Chest: non tender, normal shape and expansion Heart: RRR, normal S1, S2, no murmurs Lungs: CTA bilaterally, no wheezes, rhonchi, or rales Abdomen: +bs, soft, RLQ surgical scar, non tender, non distended, no masses, no hepatomegaly, no splenomegaly,  no bruits Back: non tender, normal ROM, no scoliosis Musculoskeletal: upper extremities non tender, no obvious deformity, normal ROM throughout, lower extremities non tender, no obvious deformity, normal ROM throughout Extremities: no edema, no cyanosis, no clubbing Pulses: 2+ symmetric, upper and lower extremities, normal cap refill Neurological: alert, oriented x 3, CN2-12 intact, strength normal upper extremities and lower extremities, sensation normal throughout, DTRs 2+ throughout, no cerebellar signs, gait normal Psychiatric: normal affect, behavior normal, pleasant  GU: normal male external genitalia,circumcised, nontender, no masses, no hernia, no lymphadenopathy Rectal: anus normal tone, prostate  moderate enlarged     Assessment and Plan :   Encounter Diagnoses  Name Primary?   Routine general medical examination at a health care facility Yes   Screening for prostate cancer    Screening for lipid disorders    Vaccine counseling    BPH without urinary obstruction    Chronic constipation    Creatinine elevation    Impaired fasting blood sugar    History of kidney stones    Allergic rhinitis due to pollen, unspecified seasonality    Mild intermittent childhood asthma without complication     Physical exam - discussed and counseled on healthy lifestyle, diet, exercise, preventative care, vaccinations, sick and well care, proper use of emergency dept and after hours care, and addressed their concerns.    Health screening: See your eye doctor yearly for routine vision care. See your dentist yearly for routine dental care including hygiene visits twice yearly.  Cancer screening Colonoscopy:  Reviewed colonoscopy on file, due repeat 2027  Discussed PSA, prostate exam, and prostate cancer screening risks/benefits.      Vaccinations: Immunization History  Administered Date(s) Administered   Influenza,inj,Quad PF,6+ Mos 06/23/2021   Moderna SARS-COV2 Booster Vaccination  06/29/2020   Moderna Sars-Covid-2 Vaccination 10/01/2019, 11/03/2019   Tdap 05/26/2012    Shingles vaccine:  I recommend you have a shingles vaccine to help prevent shingles or herpes zoster outbreak.   Please call your insurer to inquire about coverage for the Shingrix vaccine given in 2 doses.   Some insurers cover this vaccine after age 87, some cover this after age 55.  If your insurer covers this, then call to schedule appointment to have this vaccine here.    Separate significant issues discussed: Elevated creatinine - recheck labs, avoid NSAIDs and nephrotoxic substances  BPH - labs today, no major symptoms  Asthma -  no recent issues  Impaired glucose - counseled on diet, exercise, need for weight loss  Microscopic hematuria prior - updated urinalysis today   Dinero was seen today for fasting cpe.  Diagnoses and all orders for this visit:  Routine general medical examination at a health care facility -     Comprehensive metabolic panel -     CBC -     Lipid panel -     Hemoglobin A1c -     PSA -     Urinalysis, Routine w reflex microscopic -     TSH  Screening for prostate cancer -  PSA  Screening for lipid disorders -     Lipid panel  Vaccine counseling  BPH without urinary obstruction -     PSA  Chronic constipation  Creatinine elevation  Impaired fasting blood sugar -     Hemoglobin A1c  History of kidney stones  Allergic rhinitis due to pollen, unspecified seasonality  Mild intermittent childhood asthma without complication    Follow-up pending labs, yearly for physical

## 2021-08-03 ENCOUNTER — Other Ambulatory Visit: Payer: Self-pay | Admitting: Medical

## 2021-08-03 DIAGNOSIS — N400 Enlarged prostate without lower urinary tract symptoms: Secondary | ICD-10-CM

## 2021-08-03 DIAGNOSIS — R972 Elevated prostate specific antigen [PSA]: Secondary | ICD-10-CM

## 2021-08-03 LAB — COMPREHENSIVE METABOLIC PANEL
ALT: 22 IU/L (ref 0–44)
AST: 29 IU/L (ref 0–40)
Albumin/Globulin Ratio: 1.6 (ref 1.2–2.2)
Albumin: 4.5 g/dL (ref 3.8–4.9)
Alkaline Phosphatase: 66 IU/L (ref 44–121)
BUN/Creatinine Ratio: 12 (ref 9–20)
BUN: 17 mg/dL (ref 6–24)
Bilirubin Total: 0.4 mg/dL (ref 0.0–1.2)
CO2: 25 mmol/L (ref 20–29)
Calcium: 9.7 mg/dL (ref 8.7–10.2)
Chloride: 100 mmol/L (ref 96–106)
Creatinine, Ser: 1.42 mg/dL — ABNORMAL HIGH (ref 0.76–1.27)
Globulin, Total: 2.9 g/dL (ref 1.5–4.5)
Glucose: 89 mg/dL (ref 70–99)
Potassium: 4.6 mmol/L (ref 3.5–5.2)
Sodium: 140 mmol/L (ref 134–144)
Total Protein: 7.4 g/dL (ref 6.0–8.5)
eGFR: 58 mL/min/{1.73_m2} — ABNORMAL LOW (ref >59)

## 2021-08-03 LAB — URINALYSIS, ROUTINE W REFLEX MICROSCOPIC
Bilirubin, UA: NEGATIVE
Glucose, UA: NEGATIVE
Ketones, UA: NEGATIVE
Leukocytes,UA: NEGATIVE
Nitrite, UA: NEGATIVE
Protein,UA: NEGATIVE
RBC, UA: NEGATIVE
Specific Gravity, UA: 1.015 (ref 1.005–1.030)
Urobilinogen, Ur: 0.2 mg/dL (ref 0.2–1.0)
pH, UA: 5.5 (ref 5.0–7.5)

## 2021-08-03 LAB — CBC
Hematocrit: 47.5 % (ref 37.5–51.0)
Hemoglobin: 15.3 g/dL (ref 13.0–17.7)
MCH: 29 pg (ref 26.6–33.0)
MCHC: 32.2 g/dL (ref 31.5–35.7)
MCV: 90 fL (ref 79–97)
Platelets: 202 10*3/uL (ref 150–450)
RBC: 5.27 x10E6/uL (ref 4.14–5.80)
RDW: 13 % (ref 11.6–15.4)
WBC: 7.1 10*3/uL (ref 3.4–10.8)

## 2021-08-03 LAB — LIPID PANEL
Chol/HDL Ratio: 3.9 ratio (ref 0.0–5.0)
Cholesterol, Total: 166 mg/dL (ref 100–199)
HDL: 43 mg/dL (ref >39)
LDL Chol Calc (NIH): 98 mg/dL (ref 0–99)
Triglycerides: 140 mg/dL (ref 0–149)
VLDL Cholesterol Cal: 25 mg/dL (ref 5–40)

## 2021-08-03 LAB — HEMOGLOBIN A1C
Est. average glucose Bld gHb Est-mCnc: 140 mg/dL
Hgb A1c MFr Bld: 6.5 % — ABNORMAL HIGH (ref 4.8–5.6)

## 2021-08-03 LAB — PSA: Prostate Specific Ag, Serum: 5.5 ng/mL — ABNORMAL HIGH (ref 0.0–4.0)

## 2021-08-03 LAB — TSH: TSH: 1.11 u[IU]/mL (ref 0.450–4.500)

## 2021-08-04 ENCOUNTER — Telehealth: Payer: Self-pay | Admitting: Medical

## 2021-08-04 LAB — SPECIMEN STATUS REPORT

## 2021-08-04 LAB — INSULIN, RANDOM: INSULIN: 7 u[IU]/mL (ref 2.6–24.9)

## 2021-08-04 NOTE — Telephone Encounter (Signed)
See labs 

## 2021-08-04 NOTE — Telephone Encounter (Signed)
Left message for pt to call me back 

## 2022-08-03 ENCOUNTER — Encounter: Payer: BC Managed Care – PPO | Admitting: Medical
